# Patient Record
Sex: Female | Born: 1985 | Race: White | Hispanic: No | Marital: Married | Smoking: Never smoker
Health system: Southern US, Community
[De-identification: ages and names within clinical notes are randomized; demographics above are authoritative.]

## PROBLEM LIST (undated history)

## (undated) ENCOUNTER — Inpatient Hospital Stay (HOSPITAL_COMMUNITY): Payer: Self-pay

## (undated) DIAGNOSIS — R519 Headache, unspecified: Secondary | ICD-10-CM

## (undated) DIAGNOSIS — K219 Gastro-esophageal reflux disease without esophagitis: Secondary | ICD-10-CM

## (undated) DIAGNOSIS — R51 Headache: Secondary | ICD-10-CM

## (undated) DIAGNOSIS — F419 Anxiety disorder, unspecified: Secondary | ICD-10-CM

## (undated) DIAGNOSIS — T7840XA Allergy, unspecified, initial encounter: Secondary | ICD-10-CM

## (undated) HISTORY — DX: Anxiety disorder, unspecified: F41.9

## (undated) HISTORY — PX: ESOPHAGOGASTRODUODENOSCOPY: SHX1529

## (undated) HISTORY — DX: Allergy, unspecified, initial encounter: T78.40XA

## (undated) HISTORY — PX: TONSILLECTOMY AND ADENOIDECTOMY: SUR1326

## (undated) HISTORY — DX: Gastro-esophageal reflux disease without esophagitis: K21.9

---

## 2013-06-30 ENCOUNTER — Encounter (HOSPITAL_COMMUNITY): Payer: Self-pay | Admitting: Emergency Medicine

## 2013-06-30 ENCOUNTER — Emergency Department (INDEPENDENT_AMBULATORY_CARE_PROVIDER_SITE_OTHER): Payer: Self-pay

## 2013-06-30 ENCOUNTER — Emergency Department (INDEPENDENT_AMBULATORY_CARE_PROVIDER_SITE_OTHER)
Admission: EM | Admit: 2013-06-30 | Discharge: 2013-06-30 | Disposition: A | Discharge status: Home / Self Care | Payer: Self-pay | Source: Home / Self Care

## 2013-06-30 DIAGNOSIS — R141 Gas pain: Secondary | ICD-10-CM

## 2013-06-30 DIAGNOSIS — R0789 Other chest pain: Secondary | ICD-10-CM

## 2013-06-30 DIAGNOSIS — R140 Abdominal distension (gaseous): Secondary | ICD-10-CM

## 2013-06-30 NOTE — ED Provider Notes (Signed)
Medical screening examination/treatment/procedure(s) were performed by non-physician practitioner and as supervising physician I was immediately available for consultation/collaboration.  Leslee Home, M.D.  Reuben Likes, MD 06/30/13 [phone]

## 2013-06-30 NOTE — ED Notes (Signed)
Pt  Reports  Symptoms  Of  Bloating   With  Tightness  In  Chest  X  1  Week      Symptoms  Not  releived  By otc meds  Zantac   -     She  Takes  BCP  But  Reports  No  Recent   Air travel               Pt is  Sitting upright   On  Exam table  Speaking in  Complete  sentances     And  Is  In no  Acute  Distress    -  At this  Time  Pt is  Sitting  Upright on  Exam table  In no  Acute  Distress

## 2013-06-30 NOTE — ED Provider Notes (Signed)
CSN: [id number]     Arrival date & time 06/30/13  1247 History   First MD Initiated Contact with Patient 06/30/13 1344     Chief Complaint  Patient presents with  . Bloated   (Consider location/radiation/quality/duration/timing/severity/associated sxs/prior Treatment) HPI Comments: 27 year old female presents with a complaint of tightness in the upper chest for approximately one week. She states it is worse at night time particularly when A.m. and better during the day. Occasionally she will have shortness of breath. Denies sore throat or upper respiratory congestion. Denies neck pain or extremity pain. She also complains of bloating in the abdomen and recent constipation that has much improved after use of laxatives.   History reviewed. No pertinent past medical history. History reviewed. No pertinent past surgical history. History reviewed. No pertinent family history. History  Substance Use Topics  . Smoking status: Never Smoker   . Smokeless tobacco: Not on file  . Alcohol Use: Yes   OB History   Grav Para Term Preterm Abortions TAB SAB Ect Mult Living                 Review of Systems  Constitutional: Negative.  Negative for fever.  HENT: Negative.   Respiratory: Positive for chest tightness. Negative for cough, wheezing and stridor.   Cardiovascular: Positive for chest pain. Negative for palpitations and leg swelling.  Gastrointestinal: Positive for constipation. Negative for nausea, vomiting and abdominal distention.       Bloating feeling  Genitourinary: Negative.   Musculoskeletal: Negative.   Skin: Negative.   Neurological: Negative.     Allergies  Review of patient's allergies indicates not on file.  Home Medications   Current Outpatient Rx  Name  Route  Sig  Dispense  Refill  . Ranitidine HCl (ZANTAC PO)   Oral   Take by mouth.          BP 126/82  Pulse 73  Temp(Src) 99.1 F (37.3 C) (Oral)  Resp 16  SpO2 99%  LMP 06/16/2013 Physical Exam   Nursing note and vitals reviewed. Constitutional: She is oriented to person, place, and time. She appears well-developed and well-nourished. No distress.  Relaxed posturing, no distress, calm even voice.  HENT:  Right Ear: External ear normal.  Left Ear: External ear normal.  Mouth/Throat: Oropharynx is clear and moist. No oropharyngeal exudate.  Eyes: Conjunctivae and EOM are normal.  Neck: Normal range of motion. Neck supple.  Cardiovascular: Normal rate, regular rhythm and normal heart sounds.   No murmur heard. Pulmonary/Chest: Effort normal and breath sounds normal. No respiratory distress. She has no wheezes. She has no rales.  Abdominal: Soft. Bowel sounds are normal. She exhibits no distension and no mass. There is no rebound and no guarding.  Minor tenderness across mid-lower abdomen.  Musculoskeletal: She exhibits no edema.  Lymphadenopathy:    She has no cervical adenopathy.  Neurological: She is alert and oriented to person, place, and time. She exhibits normal muscle tone.  Skin: Skin is warm and dry. No rash noted. No erythema.  Psychiatric: She has a normal mood and affect.    ED Course  Procedures (including critical care time) Labs Review Labs Reviewed - No data to display Imaging Review Dg Abd 1 View  06/30/2013   CLINICAL DATA:  Abdominal bloating  EXAM: ABDOMEN - 1 VIEW  COMPARISON:  None.  FINDINGS: The bowel gas pattern is normal. No radio-opaque calculi or other significant radiographic abnormality are seen. Minimal leftward curvature of the lumbar spine  is noted centered at L3.  IMPRESSION: Negative.   Electronically Signed   By: Christiana Pellant M.D.   On: 06/30/2013 15:09    EKG: NSR, no ectopy, inverted T waves V2, V3, III.   MDM   1. Atypical chest pain   2. Abdominal bloating     Although her symptoms are relatively atypical swelling this may be more at gastrointestinal and anything else. She does have early satiety, feeling of bloating, decreased  appetite possibly reflux that she has trouble describing. She feels relatively comfortable and she is in no distress at this time. EKG is  within normal limits. she is advised that we do not have a specific diagnosis for her chest pain or abdominal bloating and that to obtain more test for diagnoses and to rule out other problems she should go to the emergency department. She declines to do that at this time and states she is feeling "okay". We will arrange to [facility] a [facility] but if she gets worse with chest pain, sweating, shortness of breath, weakness, syncope or worsening abdominal pain, vomiting she should go to emergency department promptly.  This case was discussed with Dr. Mayme Genta, NP 06/30/13 [phone]

## 2013-12-29 ENCOUNTER — Institutional Professional Consult (permissible substitution): Payer: Self-pay | Admitting: Medical

## 2014-01-08 ENCOUNTER — Encounter: Payer: Self-pay | Admitting: Medical

## 2014-01-08 ENCOUNTER — Ambulatory Visit (INDEPENDENT_AMBULATORY_CARE_PROVIDER_SITE_OTHER): Payer: BC Managed Care – PPO | Admitting: Medical

## 2014-01-08 VITALS — BP 112/80 | HR 68 | Temp 98.4°F | Resp 16 | Wt 196.0 lb

## 2014-01-08 DIAGNOSIS — R109 Unspecified abdominal pain: Secondary | ICD-10-CM

## 2014-01-08 DIAGNOSIS — R0789 Other chest pain: Secondary | ICD-10-CM

## 2014-01-08 DIAGNOSIS — R12 Heartburn: Secondary | ICD-10-CM

## 2014-01-08 LAB — POCT URINALYSIS DIPSTICK
Bilirubin, UA: NEGATIVE
Blood, UA: NEGATIVE
GLUCOSE UA: NEGATIVE
Ketones, UA: NEGATIVE
Leukocytes, UA: NEGATIVE
Nitrite, UA: NEGATIVE
Urobilinogen, UA: NEGATIVE
pH, UA: 8

## 2014-01-08 LAB — CBC WITH DIFFERENTIAL/PLATELET
Basophils Absolute: 0 10*3/uL (ref 0.0–0.1)
Basophils Relative: 0 % (ref 0–1)
Eosinophils Absolute: 0.2 10*3/uL (ref 0.0–0.7)
Eosinophils Relative: 2 % (ref 0–5)
HEMATOCRIT: 41.3 % (ref 36.0–46.0)
Hemoglobin: 14.3 g/dL (ref 12.0–15.0)
LYMPHS ABS: 2.8 10*3/uL (ref 0.7–4.0)
LYMPHS PCT: 32 % (ref 12–46)
MCH: 29.9 pg (ref 26.0–34.0)
MCHC: 34.6 g/dL (ref 30.0–36.0)
MCV: 86.2 fL (ref 78.0–100.0)
MONO ABS: 0.5 10*3/uL (ref 0.1–1.0)
MONOS PCT: 6 % (ref 3–12)
NEUTROS ABS: 5.2 10*3/uL (ref 1.7–7.7)
Neutrophils Relative %: 60 % (ref 43–77)
Platelets: 356 10*3/uL (ref 150–400)
RBC: 4.79 MIL/uL (ref 3.87–5.11)
RDW: 13 % (ref 11.5–15.5)
WBC: 8.6 10*3/uL (ref 4.0–10.5)

## 2014-01-08 LAB — COMPREHENSIVE METABOLIC PANEL
ALT: 9 U/L (ref 0–35)
AST: 15 U/L (ref 0–37)
Albumin: 3.9 g/dL (ref 3.5–5.2)
Alkaline Phosphatase: 46 U/L (ref 39–117)
BUN: 6 mg/dL (ref 6–23)
CALCIUM: 9.2 mg/dL (ref 8.4–10.5)
CHLORIDE: 102 meq/L (ref 96–112)
CO2: 28 mEq/L (ref 19–32)
Creat: 0.73 mg/dL (ref 0.50–1.10)
Glucose, Bld: 78 mg/dL (ref 70–99)
Potassium: 3.8 mEq/L (ref 3.5–5.3)
Sodium: 138 mEq/L (ref 135–145)
Total Bilirubin: 0.4 mg/dL (ref 0.2–1.2)
Total Protein: 6.9 g/dL (ref 6.0–8.3)

## 2014-01-08 LAB — POCT URINE PREGNANCY: PREG TEST UR: NEGATIVE

## 2014-01-08 MED ORDER — FAMOTIDINE 20 MG PO TABS: 20.0000 mg | ORAL_TABLET | Freq: Every day | ORAL | Status: DC

## 2014-01-08 MED ORDER — OMEPRAZOLE 40 MG PO CPDR: DELAYED_RELEASE_CAPSULE | ORAL | Status: DC

## 2014-01-08 NOTE — Patient Instructions (Signed)
Thank you for giving me the opportunity to serve you today.    Your diagnosis today includes: Encounter Diagnoses  Name Primary?  . Abdominal pain, unspecified site Yes  . Chest pressure   . Heartburn      Specific recommendations today include:  Your symptoms today suggest possible ulcer, possible gallbladder disease, and acid reflux  Begin prescription omeprazole 30-45 minutes daily before breakfast  Begin famotidine either at bedtime or twice daily also to help with reflux and acid  Avoid foods that can aggravate ulcers such as acidic foods, spicy foods, greasy foods, peppers  Avoid anti-inflammatories such as Aleve, Advil, Ibuprofen that could aggravate ulcers  Avoid food that can make gallbladder disease worse such as fatty meals, greasy foods, big portions, alcohol  Eat small portions in general  We will call with lab results  Return pending labs, 2wk.    I have included other useful information below for your review.  Peptic Ulcer A peptic ulcer is a sore in the lining of in your esophagus (esophageal ulcer), stomach (gastric ulcer), or in the first part of your small intestine (duodenal ulcer). The ulcer causes erosion into the deeper tissue. CAUSES  Normally, the lining of the stomach and the small intestine protects itself from the acid that digests food. The protective lining can be damaged by:  An infection caused by a bacterium called Helicobacter pylori (H. pylori).  Regular use of nonsteroidal anti-inflammatory drugs (NSAIDs), such as ibuprofen or aspirin.  Smoking tobacco. Other risk factors include being older than 50, drinking alcohol excessively, and having a family history of ulcer disease.  SYMPTOMS   Burning pain or gnawing in the area between the chest and the belly button.  Heartburn.  Nausea and vomiting.  Bloating. The pain can be worse on an empty stomach and at night. If the ulcer results in bleeding, it can cause:  Black, tarry  stools.  Vomiting of bright red blood.  Vomiting of coffee ground looking materials. DIAGNOSIS  A diagnosis is usually made based upon your history and an exam. Other tests and procedures may be performed to find the cause of the ulcer. Finding a cause will help determine the best treatment. Tests and procedures may include:  Blood tests, stool tests, or breath tests to check for the bacterium H. pylori.  An upper gastrointestinal (GI) series of the esophagus, stomach, and small intestine.  An endoscopy to examine the esophagus, stomach, and small intestine.  A biopsy. TREATMENT  Treatment may include:  Eliminating the cause of the ulcer, such as smoking, NSAIDs, or alcohol.  Medicines to reduce the amount of acid in your digestive tract.  Antibiotic medicines if the ulcer is caused by the H. pylori bacterium.  An upper endoscopy to treat a bleeding ulcer.  Surgery if the bleeding is severe or if the ulcer created a hole somewhere in the digestive system. HOME CARE INSTRUCTIONS   Avoid tobacco, alcohol, and caffeine. Smoking can increase the acid in the stomach, and continued smoking will impair the healing of ulcers.  Avoid foods and drinks that seem to cause discomfort or aggravate your ulcer.  Only take medicines as directed by your caregiver. Do not substitute over-the-counter medicines for prescription medicines without talking to your caregiver.  Keep any follow-up appointments and tests as directed. SEEK MEDICAL CARE IF:   Your do not improve within 7 days of starting treatment.  You have ongoing indigestion or heartburn. SEEK IMMEDIATE MEDICAL CARE IF:   You have  sudden, sharp, or persistent abdominal pain.  You have bloody or dark black, tarry stools.  You vomit blood or vomit that looks like coffee grounds.  You become light headed, weak, or feel faint.  You become sweaty or clammy. MAKE SURE YOU:   Understand these instructions.  Will watch your  condition.  Will get help right away if you are not doing well or get worse. Document Released: 08/14/2000 Document Revised: 05/11/2012 Document Reviewed: 03/16/2012 [facility] Patient Information 2014 [place], [place].   Cholecystitis Cholecystitis is an inflammation of your gallbladder. It is usually caused by a buildup of gallstones or sludge (cholelithiasis) in your gallbladder. The gallbladder stores a fluid that helps digest fats (bile). Cholecystitis is serious and needs treatment right away.  CAUSES   Gallstones. Gallstones can block the tube that leads to your gallbladder, causing bile to build up. As bile builds up, the gallbladder becomes inflamed.  Bile duct problems, such as blockage from scarring or kinking.  Tumors. Tumors can stop bile from leaving your gallbladder correctly, causing bile to build up. As bile builds up, the gallbladder becomes inflamed. SYMPTOMS   Nausea.  Vomiting.  Abdominal pain, especially in the upper right area of your abdomen.  Abdominal tenderness or bloating.  Sweating.  Chills.  Fever.  Yellowing of the skin and the whites of the eyes (jaundice). DIAGNOSIS  Your caregiver may order blood tests to look for infection or gallbladder problems. Your caregiver may also order imaging tests, such as an ultrasound or computed tomography (CT) scan. Further tests may include a hepatobiliary iminodiacetic acid (HIDA) scan. This scan allows your caregiver to see your bile move from the liver to the gallbladder and to the small intestine. TREATMENT  A hospital stay is usually necessary to lessen the inflammation of your gallbladder. You may be required to not eat or drink (fast) for a certain amount of time. You may be given medicine to treat pain or an antibiotic medicine to treat an infection. Surgery may be needed to remove your gallbladder (cholecystectomy) once the inflammation has gone down. Surgery may be needed right away if you develop  complications such as death of gallbladder tissue (gangrene) or a tear (perforation) of the gallbladder.  HOME CARE INSTRUCTIONS  Home care will depend on your treatment. In general:  If you were given antibiotics, take them as directed. Finish them even if you start to feel better.  Only take over-the-counter or prescription medicines for pain, discomfort, or fever as directed by your caregiver.  Follow a low-fat diet until you see your caregiver again.  Keep all follow-up visits as directed by your caregiver. SEEK IMMEDIATE MEDICAL CARE IF:   Your pain is increasing and not controlled by medicines.  Your pain moves to another part of your abdomen or to your back.  You have a fever.  You have nausea and vomiting. MAKE SURE YOU:  Understand these instructions.  Will watch your condition.  Will get help right away if you are not doing well or get worse. Document Released: 08/17/2005 Document Revised: 11/09/2011 Document Reviewed: 07/03/2011 [facility]ExitCare Patient Information 2014 [place]ExitCare, [place].

## 2014-01-08 NOTE — Progress Notes (Signed)
Subjective:   Yvonne Brown is a 28 y.o. female presenting on 01/08/2014 with pressure and heaviness on her chest area for about a year and had reflux and she took the 14 day of Nexium it help some  Here as a new patient.  She notes heaviness in chest, pressure in chest.   Had similar last Halloween, went to urgent care, had EKG and CXR.  No problem found.  However, pressure in chest is more constant.  Use to be more intermittent, but worse of late.  Worse when lying down.  occasionally has sharp pains in right arm pit.   In the last week, having chest pressure/heaviness of chest.  Lately seems to be constant, not going away.  Feels some pain in lower abdomen.  Feels some discomfort over her kidneys.   Sometimes chest pressure worse after eating, particular fried foods.  After eating or fried food, the pressure is intensified.  Denies belching a lot.   The chest pressure is worse lying down.   Denies wheezing, SOB, fever, nausea, vomiting, diarrhea, constipation.  Been taking OTC Nexium 14 days which helped heartburn and reflux, but didn't help chest pressure.  Denies lumps or masses in axilla or breasts.  Never had a UTI before.   Denies urinary burning, but is having some frequently, no urgency though.   No blood in stool or urine.    Denies chest pressure/pain worse with activity. She is sexually active, no vagina c/o, no abnormal bleeding. On OCPs.  Father has hx/o bowel obstruction, hx/o throat cancer, no other GI issues in family.  Nonsomker.  Drinks on average 3 beers on the weekends.  No other aggravating or relieving factors.  No other complaint.  Review of Systems ROS as in subjective      Objective:     Filed Vitals:   01/08/14 1030  BP: 112/80  Pulse: 68  Temp: 98.4 F (36.9 C)  Resp: 16    General appearance: alert, no distress, WD/WN HEENT: normocephalic, sclerae anicteric, TMs pearly, nares patent, no discharge or erythema, pharynx normal Oral cavity: MMM, no  lesions Neck: supple, no lymphadenopathy, no thyromegaly, no masses Heart: RRR, normal S1, S2, no murmurs Lungs: CTA bilaterally, no wheezes, rhonchi, or rales Abdomen: +bs, soft, mild generalized abdominal tenderness, worse across lower abdomen, no LUQ tenderness, otherwise non distended, no masses, no hepatomegaly, no splenomegaly Back: nontender Pulses: 2+ symmetric, upper and lower extremities, normal cap refill      Assessment: Encounter Diagnoses  Name Primary?  . Abdominal pain, unspecified site Yes  . Chest pressure   . Heartburn      Plan: We discussed her symptoms, exam findings, labs today. Symptoms suggest acid reflux, possible ulcer disease, possible gallbladder disease as well but not clear  Begin omeprazole, begin famotidine, avoid food triggers  Recheck in 2 weeks, sooner when necessary  Yvonne Brown was seen today for pressure and heaviness on her chest area for about a year and had reflux and she took the 14 day of nexium it help some.  Diagnoses and associated orders for this visit:  Abdominal pain, unspecified site - Comprehensive metabolic panel - CBC with Differential - Urinalysis Dipstick - POCT urine pregnancy  Chest pressure - Comprehensive metabolic panel - CBC with Differential  Heartburn - Comprehensive metabolic panel - CBC with Differential  Other Orders - omeprazole (PRILOSEC) 40 MG capsule; Take 1 tablet 30-45 minutes prior to breakfast - famotidine (PEPCID) 20 MG tablet; Take 1 tablet (20 mg  total) by mouth at bedtime.    Return pending labs, 2wk.

## 2014-01-09 ENCOUNTER — Encounter: Payer: Self-pay | Admitting: Family Medicine

## 2014-01-29 ENCOUNTER — Ambulatory Visit (INDEPENDENT_AMBULATORY_CARE_PROVIDER_SITE_OTHER): Payer: BC Managed Care – PPO | Admitting: Medical

## 2014-01-29 ENCOUNTER — Encounter: Payer: Self-pay | Admitting: Medical

## 2014-01-29 ENCOUNTER — Telehealth: Payer: Self-pay | Admitting: Medical

## 2014-01-29 VITALS — BP 100/70 | HR 60 | Temp 98.2°F | Resp 16 | Wt 194.0 lb

## 2014-01-29 DIAGNOSIS — R0789 Other chest pain: Secondary | ICD-10-CM

## 2014-01-29 DIAGNOSIS — R109 Unspecified abdominal pain: Secondary | ICD-10-CM

## 2014-01-29 NOTE — Progress Notes (Signed)
Subjective: Here for f/u on chest pressure/tightness, heartburn.  Since last visit taking Omeprazole in the morning before breakfast and Famotidine QHS.  Can't tell any improvement on the pressure, but having improvement on heartburn.  Has cut back on acidic and spicy foods, avoiding eating before 2 hours of bedtime.   Pain is worse with fried foods.  Sitting against chair or lying down seems to make it worse.   No SOB.  No problems with voiding or stooling, no fever.  Dad has hx/o small bowel obstruction.  PFM with hx/o gall bladder cancer.  Of note she did see urgent care within the past month or 2 for same, had EKG and chest x-ray which reportedly normal. He has not had an ultrasound, no prior abdominal surgery. No other aggravating or relieving facotrs.  No other c/o.   ROS as in subjective  History reviewed. No pertinent past medical history. Past Surgical History  Procedure Laterality Date  . Tonsillectomy and adenoidectomy     Objective: Filed Vitals:   01/29/14 0811  BP: 100/70  Pulse: 60  Temp: 98.2 F (36.8 C)  Resp: 16   General appearance: alert, no distress, WD/WN,  Neck: supple, no lymphadenopathy, no thyromegaly, no masses Heart: RRR, normal S1, S2, no murmurs Lungs: CTA bilaterally, no wheezes, rhonchi, or rales Abdomen: +bs, soft, non tender, non distended, no masses, no hepatomegaly, no splenomegaly Pulses: 2+ symmetric, upper and lower extremities, normal cap refill  Assessment: Encounter Diagnoses  Name Primary?  . Abdominal pain, unspecified site Yes  . Chest pressure    Plan: No improvement with H2 blocker and PPI, if symptoms still suggest possible gallbladder disease versus other gastric problem.  We will set up for abdominal ultrasound, and discussed the possibility of HIDA scan

## 2014-01-29 NOTE — Telephone Encounter (Signed)
Working on this, will call patient when done. CLS

## 2014-01-29 NOTE — Telephone Encounter (Signed)
Refer for abdominal ultrasound

## 2014-01-31 ENCOUNTER — Other Ambulatory Visit: Payer: Self-pay | Admitting: Family Medicine

## 2014-01-31 ENCOUNTER — Telehealth: Payer: Self-pay | Admitting: Family Medicine

## 2014-01-31 NOTE — Telephone Encounter (Signed)
Patient has an appointment to have a abdomen ultrasound on 02/06/14 @ 945 am. CLS NOTHING TO EAT OR DRINK AFTER MIDNIGHT  [facility] [address]  [place], [place]  [phone]

## 2014-01-31 NOTE — Telephone Encounter (Signed)
LMOM TO CB. CLS 

## 2014-02-01 NOTE — Telephone Encounter (Signed)
I left a message with all of the appointment details. CLS

## 2014-02-06 ENCOUNTER — Other Ambulatory Visit: Payer: Self-pay | Admitting: Family Medicine

## 2014-02-06 ENCOUNTER — Ambulatory Visit
Admission: RE | Admit: 2014-02-06 | Discharge: 2014-02-06 | Disposition: A | Discharge status: Home / Self Care | Payer: BC Managed Care – PPO | Source: Ambulatory Visit | Attending: Medical | Admitting: Medical

## 2014-02-06 DIAGNOSIS — R109 Unspecified abdominal pain: Secondary | ICD-10-CM

## 2014-02-06 DIAGNOSIS — R0789 Other chest pain: Secondary | ICD-10-CM

## 2014-02-06 MED ORDER — FAMOTIDINE 20 MG PO TABS: 20.0000 mg | ORAL_TABLET | Freq: Every day | ORAL | Status: DC

## 2014-02-06 MED ORDER — OMEPRAZOLE 40 MG PO CPDR: DELAYED_RELEASE_CAPSULE | ORAL | Status: DC

## 2016-02-24 ENCOUNTER — Other Ambulatory Visit: Payer: Self-pay | Admitting: Obstetrics & Gynecology

## 2016-02-24 ENCOUNTER — Other Ambulatory Visit (HOSPITAL_COMMUNITY)
Admission: RE | Admit: 2016-02-24 | Discharge: 2016-02-24 | Disposition: A | Discharge status: Home / Self Care | Payer: Managed Care, Other (non HMO) | Source: Ambulatory Visit | Attending: Obstetrics and Gynecology | Admitting: Obstetrics and Gynecology

## 2016-02-24 DIAGNOSIS — Z01419 Encounter for gynecological examination (general) (routine) without abnormal findings: Secondary | ICD-10-CM | POA: Diagnosis present

## 2016-02-25 LAB — CYTOLOGY - PAP

## 2016-09-02 DIAGNOSIS — M9902 Segmental and somatic dysfunction of thoracic region: Secondary | ICD-10-CM | POA: Diagnosis not present

## 2016-09-02 DIAGNOSIS — R51 Headache: Secondary | ICD-10-CM | POA: Diagnosis not present

## 2016-09-02 DIAGNOSIS — M9901 Segmental and somatic dysfunction of cervical region: Secondary | ICD-10-CM | POA: Diagnosis not present

## 2016-09-22 DIAGNOSIS — R51 Headache: Secondary | ICD-10-CM | POA: Diagnosis not present

## 2016-09-22 DIAGNOSIS — M9901 Segmental and somatic dysfunction of cervical region: Secondary | ICD-10-CM | POA: Diagnosis not present

## 2016-09-22 DIAGNOSIS — M9902 Segmental and somatic dysfunction of thoracic region: Secondary | ICD-10-CM | POA: Diagnosis not present

## 2016-10-13 DIAGNOSIS — M9901 Segmental and somatic dysfunction of cervical region: Secondary | ICD-10-CM | POA: Diagnosis not present

## 2016-10-13 DIAGNOSIS — R51 Headache: Secondary | ICD-10-CM | POA: Diagnosis not present

## 2016-10-13 DIAGNOSIS — M9902 Segmental and somatic dysfunction of thoracic region: Secondary | ICD-10-CM | POA: Diagnosis not present

## 2016-10-27 DIAGNOSIS — R51 Headache: Secondary | ICD-10-CM | POA: Diagnosis not present

## 2016-10-27 DIAGNOSIS — M9902 Segmental and somatic dysfunction of thoracic region: Secondary | ICD-10-CM | POA: Diagnosis not present

## 2016-10-27 DIAGNOSIS — M9901 Segmental and somatic dysfunction of cervical region: Secondary | ICD-10-CM | POA: Diagnosis not present

## 2016-11-13 ENCOUNTER — Emergency Department
Admission: EM | Admit: 2016-11-13 | Discharge: 2016-11-13 | Disposition: A | Discharge status: Home / Self Care | Payer: BLUE CROSS/BLUE SHIELD | Attending: Emergency Medicine | Admitting: Emergency Medicine

## 2016-11-13 ENCOUNTER — Encounter: Payer: Self-pay | Admitting: Emergency Medicine

## 2016-11-13 ENCOUNTER — Emergency Department: Payer: BLUE CROSS/BLUE SHIELD

## 2016-11-13 DIAGNOSIS — R51 Headache: Secondary | ICD-10-CM | POA: Diagnosis not present

## 2016-11-13 DIAGNOSIS — G8929 Other chronic pain: Secondary | ICD-10-CM | POA: Diagnosis not present

## 2016-11-13 LAB — POCT PREGNANCY, URINE: Preg Test, Ur: NEGATIVE

## 2016-11-13 MED ORDER — KETOROLAC TROMETHAMINE 30 MG/ML IJ SOLN
30.0000 mg | Freq: Once | INTRAMUSCULAR | Status: AC
  Administered 2016-11-13: 30 mg via INTRAVENOUS
  Filled 2016-11-13: qty 1

## 2016-11-13 MED ORDER — DIPHENHYDRAMINE HCL 50 MG/ML IJ SOLN
25.0000 mg | Freq: Once | INTRAMUSCULAR | Status: AC
  Administered 2016-11-13: 25 mg via INTRAVENOUS
  Filled 2016-11-13: qty 1

## 2016-11-13 MED ORDER — KETOROLAC TROMETHAMINE 10 MG PO TABS: 10.0000 mg | ORAL_TABLET | Freq: Three times a day (TID) | ORAL | 0 refills | Status: DC

## 2016-11-13 MED ORDER — SODIUM CHLORIDE 0.9 % IV BOLUS (SEPSIS)
1000.0000 mL | Freq: Once | INTRAVENOUS | Status: AC
  Administered 2016-11-13: 1000 mL via INTRAVENOUS

## 2016-11-13 MED ORDER — METOCLOPRAMIDE HCL 5 MG/ML IJ SOLN
10.0000 mg | Freq: Once | INTRAMUSCULAR | Status: AC
  Administered 2016-11-13: 10 mg via INTRAVENOUS
  Filled 2016-11-13: qty 2

## 2016-11-13 NOTE — ED Notes (Signed)
See triage note states she was on z-pak for sinus infection and is concerned that she still may have the infection.  Denies any fever or photosensitivity

## 2016-11-13 NOTE — ED Provider Notes (Signed)
[facility] Emergency Department Provider Note   ____________________________________________   First MD Initiated Contact with Patient 11/13/16 1418     (approximate)  I have reviewed the triage vital signs and the nursing notes.   HISTORY  Chief Complaint Headache    HPI Yvonne Brown is a 31 y.o. female patient is here with complaint of headache for one month. Patient describes it as a pressure sensation that radiates from forehead to the sides for head and then posteriorly. Patient states that headache varies in intensity. Patient states she has some slight nausea but denies any vomiting. She denies any changes in vision. Patient has been to a medical office where she was diagnosed with a sinus infection and has taken 3 Z-Paks without any relief of her symptoms. Denies any fever or chills. There's been no cough or congestion. Patient was taking Zyrtec every day but discontinued taking this as well. Patient denies any previous history of headaches. She has also taken Sudafed and Tylenol to relieve any sinus congestion that may be causing her headache. This also has not given her any relief. Patient was able to drive herself to the emergency room. She is here to "get a CAT scan". Patient states that she requested one from her last "doctor"and did not have one ordered. Patient states that she has seen neurology in the past for headaches and has never had a CT scan. She rates her pain as a 9/10 at this time.   History reviewed. No pertinent past medical history.  There are no active problems to display for this patient.   Past Surgical History:  Procedure Laterality Date  . TONSILLECTOMY AND ADENOIDECTOMY      Prior to Admission medications   Medication Sig Start Date End Date Taking? Authorizing Provider  ketorolac (TORADOL) 10 MG tablet Take 1 tablet (10 mg total) by mouth every 8 (eight) hours. 11/13/16   Tommi Rumpshonda L Summers, PA-C  Norgestimate-Ethinyl  Estradiol Triphasic (TRI-SPRINTEC) 0.18/0.215/0.25 MG-35 MCG tablet Take 1 tablet by mouth daily.    Historical Provider, MD    Allergies Patient has no known allergies.  No family history on file.  Social History Social History  Substance Use Topics  . Smoking status: Never Smoker  . Smokeless tobacco: Never Used  . Alcohol use Yes    Review of Systems Constitutional: No fever/chills Eyes: No visual changes. ENT: No sore throat. Cardiovascular: Denies chest pain. Respiratory: Denies shortness of breath. Gastrointestinal: No abdominal pain.  No nausea, no vomiting.   Musculoskeletal: Negative for back pain. Skin: Negative for rash. Neurological: Positive for headaches, negative for focal weakness or numbness. 10-point ROS otherwise negative.  ____________________________________________   PHYSICAL EXAM:  VITAL SIGNS: ED Triage Vitals  Enc Vitals Group     BP 11/13/16 1402 140/81     Pulse Rate 11/13/16 1402 74     Resp 11/13/16 1402 18     Temp 11/13/16 1402 98.2 F (36.8 C)     Temp Source 11/13/16 1402 Oral     SpO2 --      Weight 11/13/16 1403 215 lb (97.5 kg)     Height 11/13/16 1403 5\' 7"  (1.702 m)     Head Circumference --      Peak Flow --      Pain Score 11/13/16 1418 9     Pain Loc --      Pain Edu? --      Excl. in GC? --     Constitutional:  Alert and oriented. Well appearing and in no acute distress.  Patient is talking and answering questions appropriately. Eyes: Conjunctivae are normal. PERRL. EOMI. Head: Atraumatic. Nose: No congestion/rhinnorhea.  EACs and  TMs are clear. Mouth/Throat: Mucous membranes are moist.  Oropharynx non-erythematous. Neck: No stridor.  Nontender cervical spine to palpation posteriorly. Range of motion is normal in  all planes. There is no nuchal rigidity noted. Hematological/Lymphatic/Immunilogical: No cervical lymphadenopathy. Cardiovascular: Normal rate, regular rhythm. Grossly normal heart sounds.  Good peripheral  circulation. Respiratory: Normal respiratory effort.  No retractions. Lungs CTAB. Musculoskeletal: His upper and lower extremities without any difficulty. Normal gait was noted. Neurologic:  Normal speech and language. No gross focal neurologic deficits are appreciated. Cranial nerves II through XII grossly intact. No gait instability. Skin:  Skin is warm, dry and intact. No rash noted. Psychiatric: Mood and affect are normal. Speech and behavior are normal.  ____________________________________________   LABS (all labs ordered are listed, but only abnormal results are displayed)  Labs Reviewed  POC URINE PREG, ED  POCT PREGNANCY, URINE      RADIOLOGY CT head scan without contrast per radiologist: IMPRESSION: Normal examination.  ____________________________________________   PROCEDURES  Procedure(s) performed: None  Procedures  Critical Care performed: No  ____________________________________________   INITIAL IMPRESSION / ASSESSMENT AND PLAN / ED COURSE  Pertinent labs & imaging results that were available during my care of the patient were reviewed by me and considered in my medical decision making (see chart for details).  Patient was reassured that CT scan was negative. She was given normal saline 1 L IV along with headache cocktail consisting of Reglan 10 mg, Benadryl 25 mg, and Toradol 30 mg IV. Patient voiced improvement of her headache and had reduced by half. Patient was given a prescription for Toradol 10 mg 1 3 times a day with food for 2 days. She is to follow-up with [facility] if any continued problems or doctor of her choice. Patient states that she has seen a neurologist in the past but was "never going back". Patient is encouraged to drink plenty of fluids.      ____________________________________________   FINAL CLINICAL IMPRESSION(S) / ED DIAGNOSES  Final diagnoses:  Chronic nonintractable headache, unspecified headache type      NEW  MEDICATIONS STARTED DURING THIS VISIT:  Discharge Medication List as of 11/13/2016  5:44 PM    START taking these medications   Details  ketorolac (TORADOL) 10 MG tablet Take 1 tablet (10 mg total) by mouth every 8 (eight) hours., Starting Fri 11/13/2016, Print         Note:  This document was prepared using Dragon voice recognition software and may include unintentional dictation errors.    Tommi Rumps, PA-C 11/13/16 1830    Nita Sickle, MD 11/18/16 2233

## 2016-11-13 NOTE — Discharge Instructions (Signed)
[facility] if any continued problems with headaches. Your CT today did not show any masses, lesions, bleeding, calcifications, areas of stroke. Increase fluids to stay hydrated. Toradol one tablet 3 times a day with food for 2 days.

## 2016-11-13 NOTE — ED Triage Notes (Signed)
Presents with headaches  States she has had headaches for about 1 month  Describes as pressure..unsure of fever  Slight nausea

## 2016-11-24 DIAGNOSIS — M9902 Segmental and somatic dysfunction of thoracic region: Secondary | ICD-10-CM | POA: Diagnosis not present

## 2016-11-24 DIAGNOSIS — R51 Headache: Secondary | ICD-10-CM | POA: Diagnosis not present

## 2016-11-24 DIAGNOSIS — M9901 Segmental and somatic dysfunction of cervical region: Secondary | ICD-10-CM | POA: Diagnosis not present

## 2016-12-15 DIAGNOSIS — M9902 Segmental and somatic dysfunction of thoracic region: Secondary | ICD-10-CM | POA: Diagnosis not present

## 2016-12-15 DIAGNOSIS — M9901 Segmental and somatic dysfunction of cervical region: Secondary | ICD-10-CM | POA: Diagnosis not present

## 2016-12-15 DIAGNOSIS — R51 Headache: Secondary | ICD-10-CM | POA: Diagnosis not present

## 2017-01-05 DIAGNOSIS — M9902 Segmental and somatic dysfunction of thoracic region: Secondary | ICD-10-CM | POA: Diagnosis not present

## 2017-01-05 DIAGNOSIS — M9901 Segmental and somatic dysfunction of cervical region: Secondary | ICD-10-CM | POA: Diagnosis not present

## 2017-01-05 DIAGNOSIS — R51 Headache: Secondary | ICD-10-CM | POA: Diagnosis not present

## 2017-01-13 DIAGNOSIS — E7800 Pure hypercholesterolemia, unspecified: Secondary | ICD-10-CM | POA: Diagnosis not present

## 2017-01-13 DIAGNOSIS — E039 Hypothyroidism, unspecified: Secondary | ICD-10-CM | POA: Diagnosis not present

## 2017-01-13 DIAGNOSIS — J3489 Other specified disorders of nose and nasal sinuses: Secondary | ICD-10-CM | POA: Diagnosis not present

## 2017-01-13 DIAGNOSIS — E119 Type 2 diabetes mellitus without complications: Secondary | ICD-10-CM | POA: Diagnosis not present

## 2017-01-13 DIAGNOSIS — R52 Pain, unspecified: Secondary | ICD-10-CM | POA: Diagnosis not present

## 2017-01-13 DIAGNOSIS — Z139 Encounter for screening, unspecified: Secondary | ICD-10-CM | POA: Diagnosis not present

## 2017-01-13 DIAGNOSIS — R51 Headache: Secondary | ICD-10-CM | POA: Diagnosis not present

## 2017-01-18 ENCOUNTER — Emergency Department
Admission: EM | Admit: 2017-01-18 | Discharge: 2017-01-18 | Disposition: A | Discharge status: Home / Self Care | Payer: BLUE CROSS/BLUE SHIELD | Attending: Emergency Medicine | Admitting: Emergency Medicine

## 2017-01-18 ENCOUNTER — Encounter: Payer: Self-pay | Admitting: Emergency Medicine

## 2017-01-18 DIAGNOSIS — R51 Headache: Secondary | ICD-10-CM | POA: Diagnosis present

## 2017-01-18 DIAGNOSIS — J0141 Acute recurrent pansinusitis: Secondary | ICD-10-CM | POA: Diagnosis not present

## 2017-01-18 LAB — POCT RAPID STREP A: Streptococcus, Group A Screen (Direct): NEGATIVE

## 2017-01-18 MED ORDER — PREDNISONE 10 MG PO TABS: ORAL_TABLET | ORAL | 0 refills | Status: DC

## 2017-01-18 MED ORDER — FLUTICASONE PROPIONATE 50 MCG/ACT NA SUSP: 2.0000 | Freq: Every day | NASAL | 0 refills | Status: DC

## 2017-01-18 MED ORDER — AMOXICILLIN-POT CLAVULANATE 875-125 MG PO TABS: 1.0000 | ORAL_TABLET | Freq: Two times a day (BID) | ORAL | 0 refills | Status: AC

## 2017-01-18 NOTE — ED Notes (Signed)
See triage note  States she developed frontal headache and sinus pressure for couple of months  Recently finished z-pak  But states pain is getting worse

## 2017-01-18 NOTE — ED Triage Notes (Signed)
Bilateral earache and sinus pain x 2 months, worse in past week.

## 2017-01-18 NOTE — ED Notes (Signed)
FIRST NURSE: pt with headache going on for "a while". Pt thinks may be her sinuses. Pt crying at stat desk. No acute distress noted.

## 2017-01-18 NOTE — Discharge Instructions (Signed)
Follow-up with Dr. Elenore RotaJuengel.   Call and make an appointment. Begin taking antibiotics as directed twice a day for the next 14 days. Flonase nasal spray as directed and prednisone 60 mg and taper down to 1 tablet as directed.

## 2017-01-18 NOTE — ED Provider Notes (Signed)
[facility] Emergency Department Provider Note ____________________________________________  Time seen: 10:01 AM  I have reviewed the triage vital signs and the nursing notes.  HISTORY  Chief Complaint  Otalgia and Facial Pain   HPI Yvonne Brown is a 31 y.o. female is a complaint of facial pain and bilateral ear pain.Patient states that she has had sinus pain for approximately 2 months. She states it is been worse in the past week. Patient is unaware of any fever and denies chills. She states that there is more pressure in her ears today then she has experienced. Patient has been seen at an urgent care but not treated with anything that has helped. She states that she has been placed on Zithromax twice without improvement. Currently she rates her pain as 10 over 10.  History reviewed. No pertinent past medical history.  There are no active problems to display for this patient.   Past Surgical History:  Procedure Laterality Date  . TONSILLECTOMY AND ADENOIDECTOMY      Prior to Admission medications   Medication Sig Start Date End Date Taking? Authorizing Provider  amoxicillin-clavulanate (AUGMENTIN) 875-125 MG tablet Take 1 tablet by mouth 2 (two) times daily. 01/18/17 02/08/17  Tommi Rumps, PA-C  fluticasone (FLONASE) 50 MCG/ACT nasal spray Place 2 sprays into both nostrils daily. 01/18/17 01/18/18  Tommi Rumps, PA-C  Norgestimate-Ethinyl Estradiol Triphasic (TRI-SPRINTEC) 0.18/0.215/0.25 MG-35 MCG tablet Take 1 tablet by mouth daily.    [provider]  predniSONE (DELTASONE) 10 MG tablet Take 6 tablets  today, on day 2 take 5 tablets, day 3 take 4 tablets, day 4 take 3 tablets, day 5 take  2 tablets and 1 tablet the last day 01/18/17   Tommi Rumps, PA-C    Allergies Patient has no known allergies.  No family history on file.  Social History Social History  Substance Use Topics  . Smoking status: Never Smoker  . Smokeless  tobacco: Never Used  . Alcohol use Yes    Review of Systems  Constitutional: Negative for fever. Eyes: Negative for visual changes. ENT: Negative for sore throat. Positive for bilateral ear pain. Positive for sinus pain. Cardiovascular: Negative for chest pain. Respiratory: Negative for shortness of breath. Musculoskeletal: Negative for back pain. Skin: Negative for rash. Neurological: Negative for headaches, focal weakness or numbness. ____________________________________________  PHYSICAL EXAM:  VITAL SIGNS: ED Triage Vitals [01/18/17 0917]  Enc Vitals Group     BP 130/85     Pulse Rate 72     Resp 20     Temp 98.5 F (36.9 C)     Temp Source Oral     SpO2      Weight 215 lb (97.5 kg)     Height 5\' 7"  (1.702 m)     Head Circumference      Peak Flow      Pain Score 10     Pain Loc      Pain Edu?      Excl. in GC?     Constitutional: Alert and oriented. Well appearing and in no distress. Head: Normocephalic and atraumatic. Eyes: Conjunctivae are normal. PERRL. Normal extraocular movements Ears: Canals clear. TMs intact bilaterally. TMs are dull bilaterally with poor light reflex. Nose: Mild congestion/rhinorrhea/epistaxis.  Positive tenderness to percussion frontal and maxillary sinuses bilaterally. Mouth/Throat: Mucous membranes are moist. Positive posterior drainage. Neck: Supple. No thyromegaly. Hematological/Lymphatic/Immunological: No cervical lymphadenopathy. Cardiovascular: Normal rate, regular rhythm. Normal distal pulses. Respiratory: Normal respiratory effort. No  wheezes/rales/rhonchi. Musculoskeletal: Nontender with normal range of motion in all extremities.  Neurologic:  Normal gait without ataxia. Normal speech and language. No gross focal neurologic deficits are appreciated. Skin:  Skin is warm, dry and intact. No rash noted. Psychiatric: Mood and affect are normal. Patient exhibits appropriate insight and  judgment. ____________________________________________  INITIAL IMPRESSION / ASSESSMENT AND PLAN / ED COURSE  Patient is been on Zithromax twice without any relief of her sinus pain. Patient was started on Augmentin 875 twice a day for 14 days. She is also given a prescription for Flonase nasal spray and prednisone taper. She is to follow-up with Dr. Elenore RotaJuengel who is the ENT on call today for her recurrent sinus pain..    ____________________________________________  FINAL CLINICAL IMPRESSION(S) / ED DIAGNOSES  Final diagnoses:  Acute recurrent pansinusitis     Tommi RumpsSummers, Mylen Mangan L, PA-C 01/18/17 1422    Emily FilbertWilliams, Jonathan E, MD 01/18/17 [phone]

## 2017-01-20 DIAGNOSIS — R51 Headache: Secondary | ICD-10-CM | POA: Diagnosis not present

## 2017-01-20 DIAGNOSIS — H9313 Tinnitus, bilateral: Secondary | ICD-10-CM | POA: Diagnosis not present

## 2017-01-20 DIAGNOSIS — J019 Acute sinusitis, unspecified: Secondary | ICD-10-CM | POA: Diagnosis not present

## 2017-01-22 DIAGNOSIS — R51 Headache: Secondary | ICD-10-CM | POA: Diagnosis not present

## 2017-01-22 DIAGNOSIS — H9319 Tinnitus, unspecified ear: Secondary | ICD-10-CM | POA: Diagnosis not present

## 2017-01-22 DIAGNOSIS — H9313 Tinnitus, bilateral: Secondary | ICD-10-CM | POA: Diagnosis not present

## 2017-01-25 DIAGNOSIS — R51 Headache: Secondary | ICD-10-CM | POA: Diagnosis not present

## 2017-01-25 DIAGNOSIS — J019 Acute sinusitis, unspecified: Secondary | ICD-10-CM | POA: Diagnosis not present

## 2017-01-25 DIAGNOSIS — J329 Chronic sinusitis, unspecified: Secondary | ICD-10-CM | POA: Diagnosis not present

## 2017-01-25 DIAGNOSIS — J342 Deviated nasal septum: Secondary | ICD-10-CM | POA: Diagnosis not present

## 2017-01-26 DIAGNOSIS — M9901 Segmental and somatic dysfunction of cervical region: Secondary | ICD-10-CM | POA: Diagnosis not present

## 2017-01-26 DIAGNOSIS — R51 Headache: Secondary | ICD-10-CM | POA: Diagnosis not present

## 2017-01-26 DIAGNOSIS — M9902 Segmental and somatic dysfunction of thoracic region: Secondary | ICD-10-CM | POA: Diagnosis not present

## 2017-01-27 DIAGNOSIS — R51 Headache: Secondary | ICD-10-CM | POA: Diagnosis not present

## 2017-01-27 DIAGNOSIS — M50321 Other cervical disc degeneration at C4-C5 level: Secondary | ICD-10-CM | POA: Diagnosis not present

## 2017-02-01 DIAGNOSIS — J329 Chronic sinusitis, unspecified: Secondary | ICD-10-CM | POA: Diagnosis not present

## 2017-02-01 DIAGNOSIS — J342 Deviated nasal septum: Secondary | ICD-10-CM | POA: Diagnosis not present

## 2017-02-01 DIAGNOSIS — R51 Headache: Secondary | ICD-10-CM | POA: Diagnosis not present

## 2017-02-02 DIAGNOSIS — M9901 Segmental and somatic dysfunction of cervical region: Secondary | ICD-10-CM | POA: Diagnosis not present

## 2017-02-02 DIAGNOSIS — M9902 Segmental and somatic dysfunction of thoracic region: Secondary | ICD-10-CM | POA: Diagnosis not present

## 2017-02-02 DIAGNOSIS — R51 Headache: Secondary | ICD-10-CM | POA: Diagnosis not present

## 2017-02-11 DIAGNOSIS — Z79899 Other long term (current) drug therapy: Secondary | ICD-10-CM | POA: Diagnosis not present

## 2017-02-11 DIAGNOSIS — R51 Headache: Secondary | ICD-10-CM | POA: Diagnosis not present

## 2017-02-23 DIAGNOSIS — R51 Headache: Secondary | ICD-10-CM | POA: Diagnosis not present

## 2017-02-23 DIAGNOSIS — M9903 Segmental and somatic dysfunction of lumbar region: Secondary | ICD-10-CM | POA: Diagnosis not present

## 2017-02-23 DIAGNOSIS — M9902 Segmental and somatic dysfunction of thoracic region: Secondary | ICD-10-CM | POA: Diagnosis not present

## 2017-02-23 DIAGNOSIS — M9901 Segmental and somatic dysfunction of cervical region: Secondary | ICD-10-CM | POA: Diagnosis not present

## 2017-04-13 DIAGNOSIS — M9902 Segmental and somatic dysfunction of thoracic region: Secondary | ICD-10-CM | POA: Diagnosis not present

## 2017-04-13 DIAGNOSIS — M9901 Segmental and somatic dysfunction of cervical region: Secondary | ICD-10-CM | POA: Diagnosis not present

## 2017-04-13 DIAGNOSIS — M9903 Segmental and somatic dysfunction of lumbar region: Secondary | ICD-10-CM | POA: Diagnosis not present

## 2017-04-13 DIAGNOSIS — R51 Headache: Secondary | ICD-10-CM | POA: Diagnosis not present

## 2017-05-04 DIAGNOSIS — R51 Headache: Secondary | ICD-10-CM | POA: Diagnosis not present

## 2017-05-04 DIAGNOSIS — M9901 Segmental and somatic dysfunction of cervical region: Secondary | ICD-10-CM | POA: Diagnosis not present

## 2017-05-04 DIAGNOSIS — M9902 Segmental and somatic dysfunction of thoracic region: Secondary | ICD-10-CM | POA: Diagnosis not present

## 2017-05-04 DIAGNOSIS — M9903 Segmental and somatic dysfunction of lumbar region: Secondary | ICD-10-CM | POA: Diagnosis not present

## 2017-06-04 IMAGING — CT CT HEAD W/O CM
3 series · 14 of 43 positions shown, 16 images · non-contrast
Comparison: None.

CLINICAL DATA: Headaches for the past month.  Slight nausea.

EXAM:
CT HEAD WITHOUT CONTRAST
TECHNIQUE: Contiguous axial images were obtained from the base of the skull
through the vertex without intravenous contrast.

[Series 2: ax head wo · axial · 0.36mm/px · z∈[-17,+87]mm · 8 of 26 slices shown, 10 images]
[im 3/26  brain]
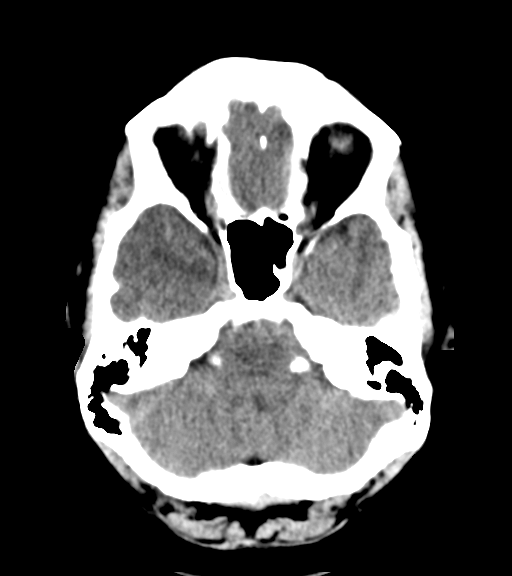
[im 3/26  bone]
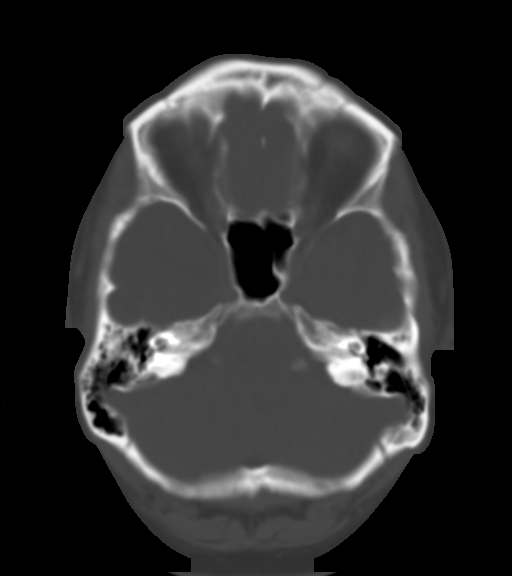
[im 6/26  brain]
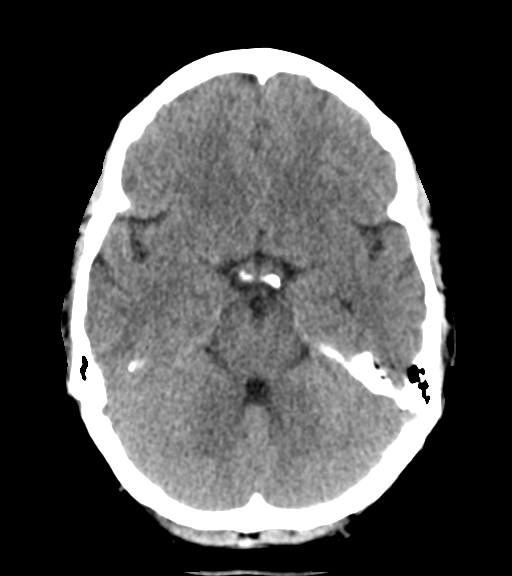
[im 9/26  brain]
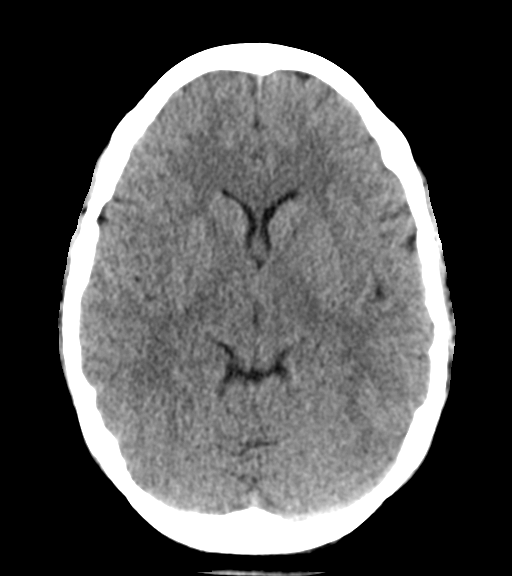
[im 12/26  brain]
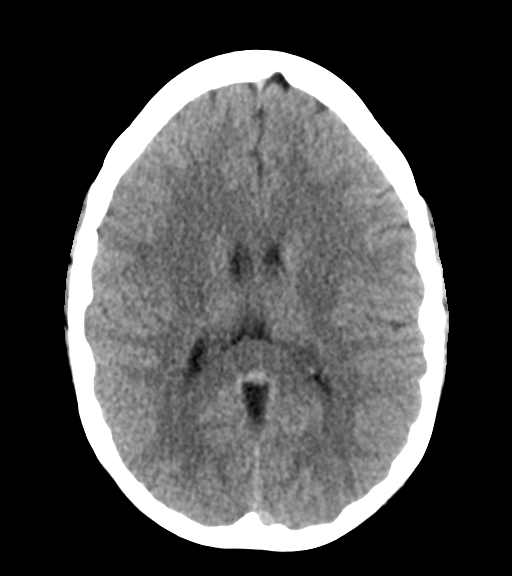
[im 15/26  brain]
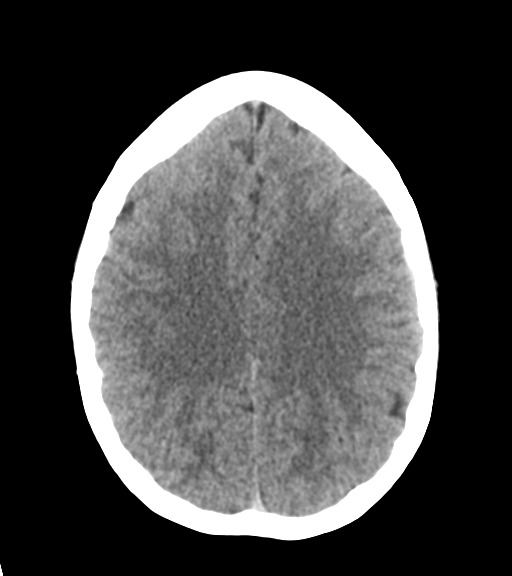
[im 15/26  bone]
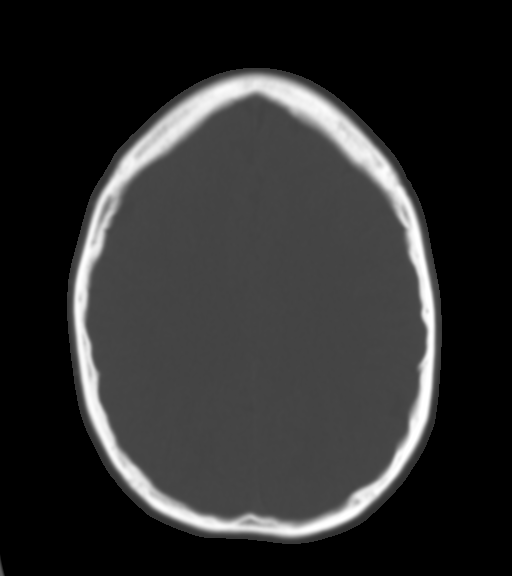
[im 18/26  brain]
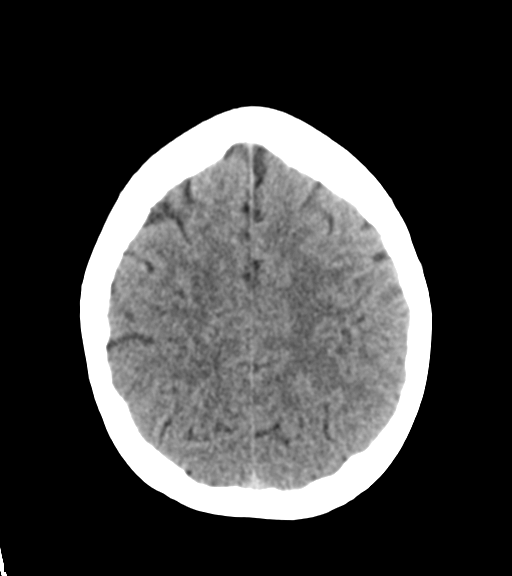
[im 21/26  brain]
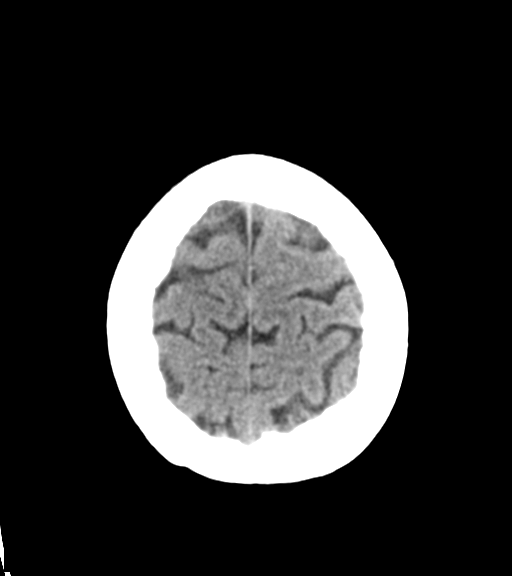
[im 24/26  brain]
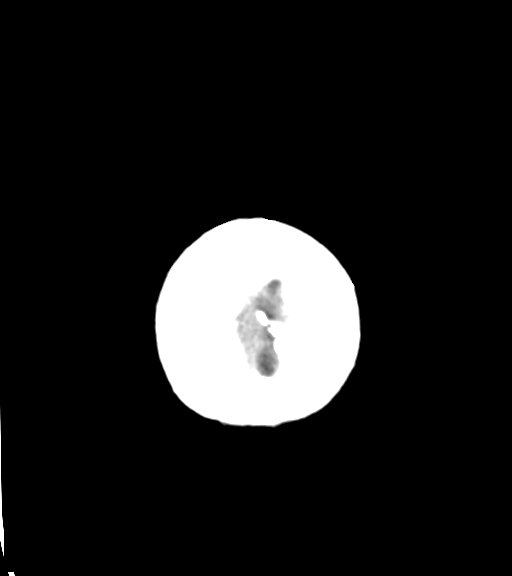

[Series 5: coronal soft tissue · coronal · 0.28mm/px · 3 of 60 slices shown]
[im 20/60  brain]
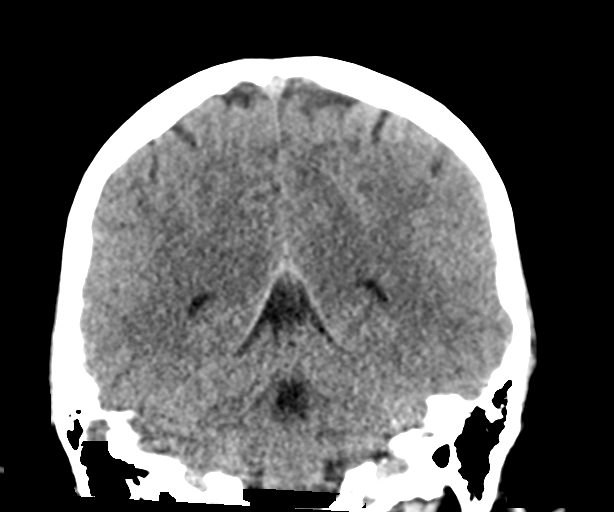
[im 27/60  brain]
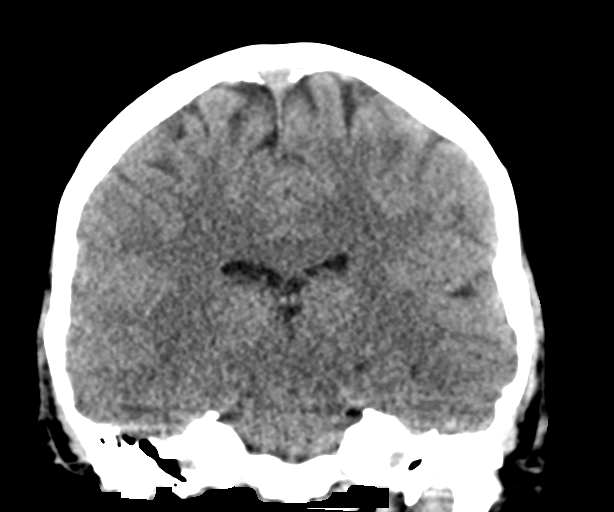
[im 33/60  brain]
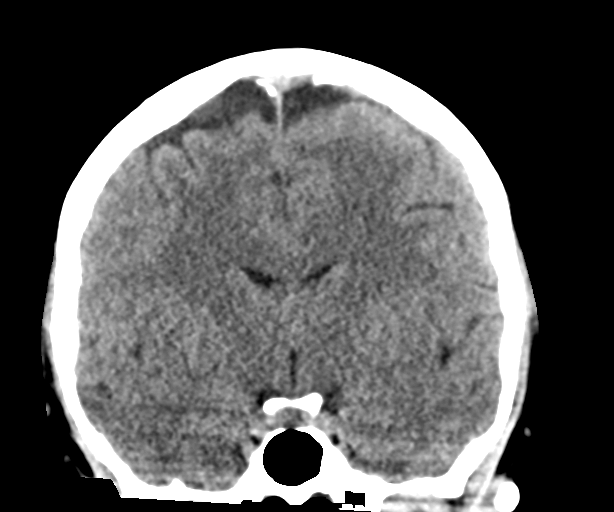

[Series 6: sagittal soft tissue · sagittal · 0.29mm/px · 3 of 48 slices shown]
[im 16/48  brain]
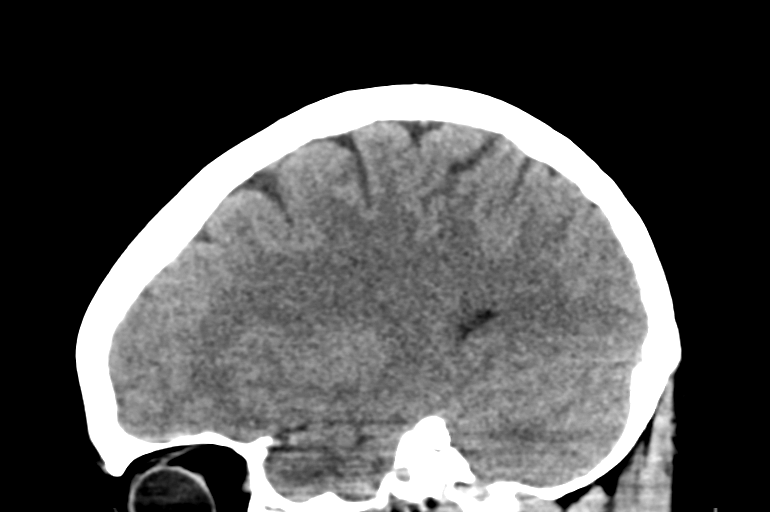
[im 24/48  brain]
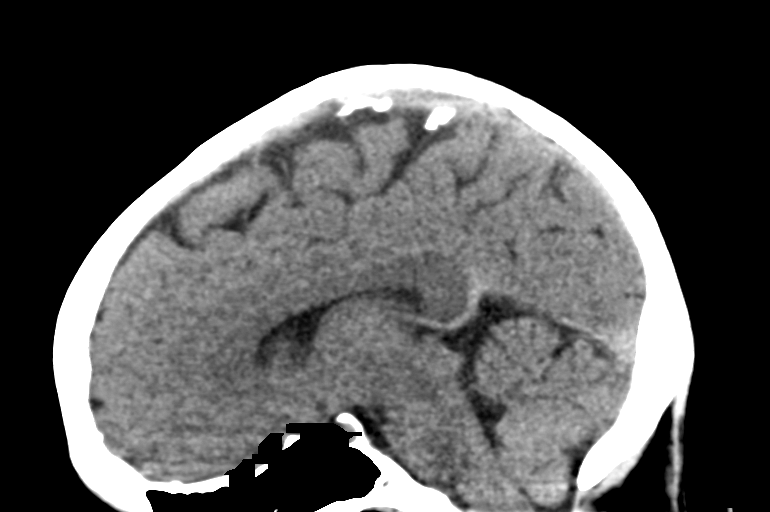
[im 32/48  brain]
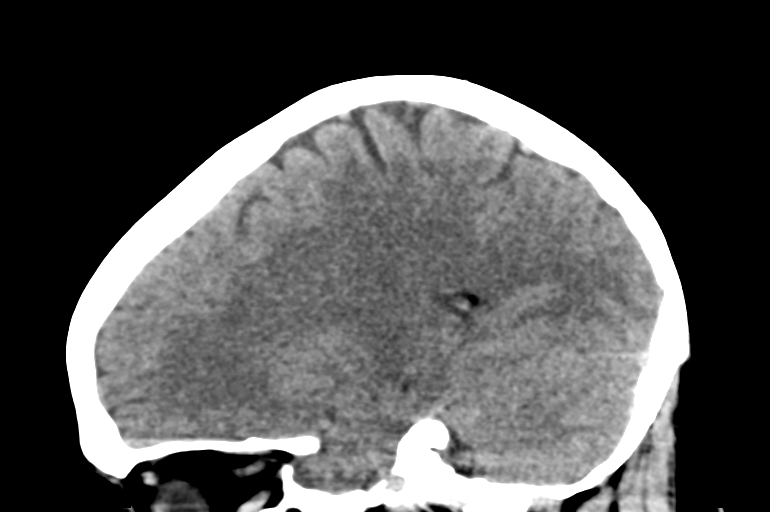

[14 of 43 positions shown; findings below may reference images not displayed]

FINDINGS: Brain: No evidence of acute infarction, hemorrhage, hydrocephalus,
extra-axial collection or mass lesion/mass effect.

Vascular: No hyperdense vessel or unexpected calcification.

Skull: Normal. Negative for fracture or focal lesion.

Sinuses/Orbits: No acute finding.

Other: None.
IMPRESSION: Normal examination.

## 2017-06-15 DIAGNOSIS — R51 Headache: Secondary | ICD-10-CM | POA: Diagnosis not present

## 2017-06-15 DIAGNOSIS — M9901 Segmental and somatic dysfunction of cervical region: Secondary | ICD-10-CM | POA: Diagnosis not present

## 2017-06-15 DIAGNOSIS — M9903 Segmental and somatic dysfunction of lumbar region: Secondary | ICD-10-CM | POA: Diagnosis not present

## 2017-06-15 DIAGNOSIS — M9902 Segmental and somatic dysfunction of thoracic region: Secondary | ICD-10-CM | POA: Diagnosis not present

## 2017-06-16 ENCOUNTER — Other Ambulatory Visit: Payer: Self-pay | Admitting: Obstetrics & Gynecology

## 2017-06-16 DIAGNOSIS — Z3009 Encounter for other general counseling and advice on contraception: Secondary | ICD-10-CM | POA: Diagnosis not present

## 2017-06-16 DIAGNOSIS — N6320 Unspecified lump in the left breast, unspecified quadrant: Secondary | ICD-10-CM | POA: Diagnosis not present

## 2017-06-16 DIAGNOSIS — N63 Unspecified lump in unspecified breast: Secondary | ICD-10-CM

## 2017-06-21 ENCOUNTER — Encounter: Payer: Self-pay | Admitting: Radiology

## 2017-06-21 ENCOUNTER — Ambulatory Visit
Admission: RE | Admit: 2017-06-21 | Discharge: 2017-06-21 | Disposition: A | Discharge status: Home / Self Care | Payer: BLUE CROSS/BLUE SHIELD | Source: Ambulatory Visit | Attending: Obstetrics & Gynecology | Admitting: Obstetrics & Gynecology

## 2017-06-21 DIAGNOSIS — N6323 Unspecified lump in the left breast, lower outer quadrant: Secondary | ICD-10-CM | POA: Diagnosis not present

## 2017-06-21 DIAGNOSIS — N63 Unspecified lump in unspecified breast: Secondary | ICD-10-CM

## 2017-06-21 DIAGNOSIS — N6324 Unspecified lump in the left breast, lower inner quadrant: Secondary | ICD-10-CM | POA: Diagnosis not present

## 2017-06-29 DIAGNOSIS — M9902 Segmental and somatic dysfunction of thoracic region: Secondary | ICD-10-CM | POA: Diagnosis not present

## 2017-06-29 DIAGNOSIS — M9903 Segmental and somatic dysfunction of lumbar region: Secondary | ICD-10-CM | POA: Diagnosis not present

## 2017-06-29 DIAGNOSIS — M9901 Segmental and somatic dysfunction of cervical region: Secondary | ICD-10-CM | POA: Diagnosis not present

## 2017-06-29 DIAGNOSIS — R51 Headache: Secondary | ICD-10-CM | POA: Diagnosis not present

## 2017-07-02 DIAGNOSIS — M792 Neuralgia and neuritis, unspecified: Secondary | ICD-10-CM | POA: Diagnosis not present

## 2017-07-02 DIAGNOSIS — G501 Atypical facial pain: Secondary | ICD-10-CM | POA: Diagnosis not present

## 2017-07-02 DIAGNOSIS — H9203 Otalgia, bilateral: Secondary | ICD-10-CM | POA: Diagnosis not present

## 2017-07-13 DIAGNOSIS — M9902 Segmental and somatic dysfunction of thoracic region: Secondary | ICD-10-CM | POA: Diagnosis not present

## 2017-07-13 DIAGNOSIS — M9903 Segmental and somatic dysfunction of lumbar region: Secondary | ICD-10-CM | POA: Diagnosis not present

## 2017-07-13 DIAGNOSIS — R51 Headache: Secondary | ICD-10-CM | POA: Diagnosis not present

## 2017-07-13 DIAGNOSIS — M9901 Segmental and somatic dysfunction of cervical region: Secondary | ICD-10-CM | POA: Diagnosis not present

## 2017-07-27 DIAGNOSIS — M9903 Segmental and somatic dysfunction of lumbar region: Secondary | ICD-10-CM | POA: Diagnosis not present

## 2017-07-27 DIAGNOSIS — M9902 Segmental and somatic dysfunction of thoracic region: Secondary | ICD-10-CM | POA: Diagnosis not present

## 2017-07-27 DIAGNOSIS — R51 Headache: Secondary | ICD-10-CM | POA: Diagnosis not present

## 2017-07-27 DIAGNOSIS — M9901 Segmental and somatic dysfunction of cervical region: Secondary | ICD-10-CM | POA: Diagnosis not present

## 2017-08-17 DIAGNOSIS — R51 Headache: Secondary | ICD-10-CM | POA: Diagnosis not present

## 2017-08-17 DIAGNOSIS — M9903 Segmental and somatic dysfunction of lumbar region: Secondary | ICD-10-CM | POA: Diagnosis not present

## 2017-08-17 DIAGNOSIS — M9902 Segmental and somatic dysfunction of thoracic region: Secondary | ICD-10-CM | POA: Diagnosis not present

## 2017-08-17 DIAGNOSIS — M9901 Segmental and somatic dysfunction of cervical region: Secondary | ICD-10-CM | POA: Diagnosis not present

## 2017-08-31 NOTE — L&D Delivery Note (Signed)
Delivery Note At 12:46 PM a viable female was delivered via Vaginal, Spontaneous (Presentation: OA  ).  APGAR: 9, 9; weight pending.   Placenta status: to L&D.  Cord:  with the following complications: none .  Cord pH: n/a  Anesthesia:  epidural Episiotomy: Right Mediolateral Lacerations: None Suture Repair: 2.0 3.0 vicryl Est. Blood Loss (mL): 261  Mom to postpartum.  Baby to Couplet care / Skin to Skin.  Alessandra BevelsJennifer M Kimberley Speece 08/14/2018, 1:05 PM

## 2017-09-07 DIAGNOSIS — R51 Headache: Secondary | ICD-10-CM | POA: Diagnosis not present

## 2017-09-07 DIAGNOSIS — M9902 Segmental and somatic dysfunction of thoracic region: Secondary | ICD-10-CM | POA: Diagnosis not present

## 2017-09-07 DIAGNOSIS — M9903 Segmental and somatic dysfunction of lumbar region: Secondary | ICD-10-CM | POA: Diagnosis not present

## 2017-09-07 DIAGNOSIS — M9901 Segmental and somatic dysfunction of cervical region: Secondary | ICD-10-CM | POA: Diagnosis not present

## 2017-09-21 DIAGNOSIS — M9902 Segmental and somatic dysfunction of thoracic region: Secondary | ICD-10-CM | POA: Diagnosis not present

## 2017-09-21 DIAGNOSIS — R51 Headache: Secondary | ICD-10-CM | POA: Diagnosis not present

## 2017-09-21 DIAGNOSIS — M9903 Segmental and somatic dysfunction of lumbar region: Secondary | ICD-10-CM | POA: Diagnosis not present

## 2017-09-21 DIAGNOSIS — M9901 Segmental and somatic dysfunction of cervical region: Secondary | ICD-10-CM | POA: Diagnosis not present

## 2017-09-22 DIAGNOSIS — M26609 Unspecified temporomandibular joint disorder, unspecified side: Secondary | ICD-10-CM | POA: Diagnosis not present

## 2017-09-22 DIAGNOSIS — Z6833 Body mass index (BMI) 33.0-33.9, adult: Secondary | ICD-10-CM | POA: Diagnosis not present

## 2017-10-05 DIAGNOSIS — M9902 Segmental and somatic dysfunction of thoracic region: Secondary | ICD-10-CM | POA: Diagnosis not present

## 2017-10-05 DIAGNOSIS — M9903 Segmental and somatic dysfunction of lumbar region: Secondary | ICD-10-CM | POA: Diagnosis not present

## 2017-10-05 DIAGNOSIS — R51 Headache: Secondary | ICD-10-CM | POA: Diagnosis not present

## 2017-10-05 DIAGNOSIS — M9901 Segmental and somatic dysfunction of cervical region: Secondary | ICD-10-CM | POA: Diagnosis not present

## 2017-10-14 DIAGNOSIS — G501 Atypical facial pain: Secondary | ICD-10-CM | POA: Diagnosis not present

## 2017-10-14 DIAGNOSIS — F458 Other somatoform disorders: Secondary | ICD-10-CM | POA: Diagnosis not present

## 2017-10-19 DIAGNOSIS — M9903 Segmental and somatic dysfunction of lumbar region: Secondary | ICD-10-CM | POA: Diagnosis not present

## 2017-10-19 DIAGNOSIS — M9901 Segmental and somatic dysfunction of cervical region: Secondary | ICD-10-CM | POA: Diagnosis not present

## 2017-10-19 DIAGNOSIS — R51 Headache: Secondary | ICD-10-CM | POA: Diagnosis not present

## 2017-10-19 DIAGNOSIS — M9902 Segmental and somatic dysfunction of thoracic region: Secondary | ICD-10-CM | POA: Diagnosis not present

## 2017-10-22 DIAGNOSIS — Z0001 Encounter for general adult medical examination with abnormal findings: Secondary | ICD-10-CM | POA: Diagnosis not present

## 2017-10-22 DIAGNOSIS — R51 Headache: Secondary | ICD-10-CM | POA: Diagnosis not present

## 2017-11-01 DIAGNOSIS — Z1331 Encounter for screening for depression: Secondary | ICD-10-CM | POA: Diagnosis not present

## 2017-11-01 DIAGNOSIS — Z0000 Encounter for general adult medical examination without abnormal findings: Secondary | ICD-10-CM | POA: Diagnosis not present

## 2017-11-01 DIAGNOSIS — Z6833 Body mass index (BMI) 33.0-33.9, adult: Secondary | ICD-10-CM | POA: Diagnosis not present

## 2017-11-04 DIAGNOSIS — M9902 Segmental and somatic dysfunction of thoracic region: Secondary | ICD-10-CM | POA: Diagnosis not present

## 2017-11-04 DIAGNOSIS — M9903 Segmental and somatic dysfunction of lumbar region: Secondary | ICD-10-CM | POA: Diagnosis not present

## 2017-11-04 DIAGNOSIS — M9901 Segmental and somatic dysfunction of cervical region: Secondary | ICD-10-CM | POA: Diagnosis not present

## 2017-11-04 DIAGNOSIS — R51 Headache: Secondary | ICD-10-CM | POA: Diagnosis not present

## 2017-11-16 DIAGNOSIS — R51 Headache: Secondary | ICD-10-CM | POA: Diagnosis not present

## 2017-11-16 DIAGNOSIS — M9902 Segmental and somatic dysfunction of thoracic region: Secondary | ICD-10-CM | POA: Diagnosis not present

## 2017-11-16 DIAGNOSIS — M9903 Segmental and somatic dysfunction of lumbar region: Secondary | ICD-10-CM | POA: Diagnosis not present

## 2017-11-16 DIAGNOSIS — M9901 Segmental and somatic dysfunction of cervical region: Secondary | ICD-10-CM | POA: Diagnosis not present

## 2017-11-19 DIAGNOSIS — N898 Other specified noninflammatory disorders of vagina: Secondary | ICD-10-CM | POA: Diagnosis not present

## 2017-11-19 DIAGNOSIS — R300 Dysuria: Secondary | ICD-10-CM | POA: Diagnosis not present

## 2017-11-19 DIAGNOSIS — R102 Pelvic and perineal pain: Secondary | ICD-10-CM | POA: Diagnosis not present

## 2017-11-19 DIAGNOSIS — Z3009 Encounter for other general counseling and advice on contraception: Secondary | ICD-10-CM | POA: Diagnosis not present

## 2017-11-30 DIAGNOSIS — M9901 Segmental and somatic dysfunction of cervical region: Secondary | ICD-10-CM | POA: Diagnosis not present

## 2017-11-30 DIAGNOSIS — M9902 Segmental and somatic dysfunction of thoracic region: Secondary | ICD-10-CM | POA: Diagnosis not present

## 2017-11-30 DIAGNOSIS — R51 Headache: Secondary | ICD-10-CM | POA: Diagnosis not present

## 2017-11-30 DIAGNOSIS — M9903 Segmental and somatic dysfunction of lumbar region: Secondary | ICD-10-CM | POA: Diagnosis not present

## 2017-12-14 DIAGNOSIS — M9902 Segmental and somatic dysfunction of thoracic region: Secondary | ICD-10-CM | POA: Diagnosis not present

## 2017-12-14 DIAGNOSIS — R51 Headache: Secondary | ICD-10-CM | POA: Diagnosis not present

## 2017-12-14 DIAGNOSIS — M9901 Segmental and somatic dysfunction of cervical region: Secondary | ICD-10-CM | POA: Diagnosis not present

## 2017-12-14 DIAGNOSIS — M9903 Segmental and somatic dysfunction of lumbar region: Secondary | ICD-10-CM | POA: Diagnosis not present

## 2017-12-20 DIAGNOSIS — Z3481 Encounter for supervision of other normal pregnancy, first trimester: Secondary | ICD-10-CM | POA: Diagnosis not present

## 2017-12-23 DIAGNOSIS — T7840XA Allergy, unspecified, initial encounter: Secondary | ICD-10-CM | POA: Diagnosis not present

## 2017-12-23 DIAGNOSIS — R51 Headache: Secondary | ICD-10-CM | POA: Diagnosis not present

## 2017-12-23 LAB — OB RESULTS CONSOLE RPR: RPR: NONREACTIVE

## 2017-12-23 LAB — OB RESULTS CONSOLE GC/CHLAMYDIA
Chlamydia: NEGATIVE
Gonorrhea: NEGATIVE

## 2017-12-23 LAB — OB RESULTS CONSOLE HIV ANTIBODY (ROUTINE TESTING): HIV: NONREACTIVE

## 2017-12-23 LAB — OB RESULTS CONSOLE ANTIBODY SCREEN: Antibody Screen: NEGATIVE

## 2017-12-23 LAB — OB RESULTS CONSOLE RUBELLA ANTIBODY, IGM: Rubella: IMMUNE

## 2017-12-23 LAB — OB RESULTS CONSOLE ABO/RH: RH Type: POSITIVE

## 2017-12-23 LAB — OB RESULTS CONSOLE HEPATITIS B SURFACE ANTIGEN: Hepatitis B Surface Ag: NEGATIVE

## 2017-12-28 DIAGNOSIS — R51 Headache: Secondary | ICD-10-CM | POA: Diagnosis not present

## 2017-12-28 DIAGNOSIS — M9901 Segmental and somatic dysfunction of cervical region: Secondary | ICD-10-CM | POA: Diagnosis not present

## 2017-12-28 DIAGNOSIS — M9903 Segmental and somatic dysfunction of lumbar region: Secondary | ICD-10-CM | POA: Diagnosis not present

## 2017-12-28 DIAGNOSIS — M9902 Segmental and somatic dysfunction of thoracic region: Secondary | ICD-10-CM | POA: Diagnosis not present

## 2018-01-17 DIAGNOSIS — Z3482 Encounter for supervision of other normal pregnancy, second trimester: Secondary | ICD-10-CM | POA: Diagnosis not present

## 2018-01-18 DIAGNOSIS — Z3A10 10 weeks gestation of pregnancy: Secondary | ICD-10-CM | POA: Diagnosis not present

## 2018-01-18 DIAGNOSIS — O26899 Other specified pregnancy related conditions, unspecified trimester: Secondary | ICD-10-CM | POA: Diagnosis not present

## 2018-01-25 DIAGNOSIS — M9901 Segmental and somatic dysfunction of cervical region: Secondary | ICD-10-CM | POA: Diagnosis not present

## 2018-01-25 DIAGNOSIS — M9902 Segmental and somatic dysfunction of thoracic region: Secondary | ICD-10-CM | POA: Diagnosis not present

## 2018-01-25 DIAGNOSIS — R51 Headache: Secondary | ICD-10-CM | POA: Diagnosis not present

## 2018-01-25 DIAGNOSIS — M9903 Segmental and somatic dysfunction of lumbar region: Secondary | ICD-10-CM | POA: Diagnosis not present

## 2018-01-26 DIAGNOSIS — Z6831 Body mass index (BMI) 31.0-31.9, adult: Secondary | ICD-10-CM | POA: Diagnosis not present

## 2018-01-26 DIAGNOSIS — J069 Acute upper respiratory infection, unspecified: Secondary | ICD-10-CM | POA: Diagnosis not present

## 2018-03-15 DIAGNOSIS — Z3482 Encounter for supervision of other normal pregnancy, second trimester: Secondary | ICD-10-CM | POA: Diagnosis not present

## 2018-03-29 DIAGNOSIS — Z3A20 20 weeks gestation of pregnancy: Secondary | ICD-10-CM | POA: Diagnosis not present

## 2018-03-29 DIAGNOSIS — Z36 Encounter for antenatal screening for chromosomal anomalies: Secondary | ICD-10-CM | POA: Diagnosis not present

## 2018-04-01 DIAGNOSIS — J029 Acute pharyngitis, unspecified: Secondary | ICD-10-CM | POA: Diagnosis not present

## 2018-04-01 DIAGNOSIS — Z6832 Body mass index (BMI) 32.0-32.9, adult: Secondary | ICD-10-CM | POA: Diagnosis not present

## 2018-05-12 DIAGNOSIS — Z3482 Encounter for supervision of other normal pregnancy, second trimester: Secondary | ICD-10-CM | POA: Diagnosis not present

## 2018-05-12 DIAGNOSIS — N898 Other specified noninflammatory disorders of vagina: Secondary | ICD-10-CM | POA: Diagnosis not present

## 2018-05-13 DIAGNOSIS — Z3482 Encounter for supervision of other normal pregnancy, second trimester: Secondary | ICD-10-CM | POA: Diagnosis not present

## 2018-07-21 DIAGNOSIS — Z3483 Encounter for supervision of other normal pregnancy, third trimester: Secondary | ICD-10-CM | POA: Diagnosis not present

## 2018-07-21 LAB — OB RESULTS CONSOLE GBS: GBS: NEGATIVE

## 2018-08-03 ENCOUNTER — Inpatient Hospital Stay (HOSPITAL_COMMUNITY)
Admission: AD | Admit: 2018-08-03 | Discharge: 2018-08-03 | Disposition: A | Discharge status: Home / Self Care | Payer: BLUE CROSS/BLUE SHIELD | Source: Ambulatory Visit | Attending: Obstetrics & Gynecology | Admitting: Obstetrics & Gynecology

## 2018-08-03 ENCOUNTER — Encounter (HOSPITAL_COMMUNITY): Payer: Self-pay | Admitting: *Deleted

## 2018-08-03 DIAGNOSIS — R51 Headache: Secondary | ICD-10-CM | POA: Diagnosis not present

## 2018-08-03 DIAGNOSIS — R112 Nausea with vomiting, unspecified: Secondary | ICD-10-CM

## 2018-08-03 DIAGNOSIS — Z3A38 38 weeks gestation of pregnancy: Secondary | ICD-10-CM | POA: Diagnosis not present

## 2018-08-03 DIAGNOSIS — O212 Late vomiting of pregnancy: Secondary | ICD-10-CM | POA: Insufficient documentation

## 2018-08-03 DIAGNOSIS — O26893 Other specified pregnancy related conditions, third trimester: Secondary | ICD-10-CM | POA: Insufficient documentation

## 2018-08-03 HISTORY — DX: Headache: R51

## 2018-08-03 HISTORY — DX: Headache, unspecified: R51.9

## 2018-08-03 LAB — COMPREHENSIVE METABOLIC PANEL
ALT: 13 U/L (ref 0–44)
ANION GAP: 9 (ref 5–15)
AST: 18 U/L (ref 15–41)
Albumin: 3.3 g/dL — ABNORMAL LOW (ref 3.5–5.0)
Alkaline Phosphatase: 140 U/L — ABNORMAL HIGH (ref 38–126)
BUN: <5 mg/dL — ABNORMAL LOW (ref 6–20)
CHLORIDE: 103 mmol/L (ref 98–111)
CO2: 23 mmol/L (ref 22–32)
Calcium: 8.9 mg/dL (ref 8.9–10.3)
Creatinine, Ser: 0.44 mg/dL (ref 0.44–1.00)
GFR calc Af Amer: >60 mL/min (ref >60)
Glucose, Bld: 74 mg/dL (ref 70–99)
Potassium: 4.1 mmol/L (ref 3.5–5.1)
Sodium: 135 mmol/L (ref 135–145)
Total Bilirubin: 0.8 mg/dL (ref 0.3–1.2)
Total Protein: 6.5 g/dL (ref 6.5–8.1)

## 2018-08-03 LAB — CBC WITH DIFFERENTIAL/PLATELET
BASOS PCT: 0 %
Basophils Absolute: 0 10*3/uL (ref 0.0–0.1)
Eosinophils Absolute: 0.1 10*3/uL (ref 0.0–0.5)
Eosinophils Relative: 1 %
HCT: 39.6 % (ref 36.0–46.0)
Hemoglobin: 13.3 g/dL (ref 12.0–15.0)
Lymphocytes Relative: 25 %
Lymphs Abs: 2.3 10*3/uL (ref 0.7–4.0)
MCH: 29.8 pg (ref 26.0–34.0)
MCHC: 33.6 g/dL (ref 30.0–36.0)
MCV: 88.8 fL (ref 80.0–100.0)
Monocytes Absolute: 0.3 10*3/uL (ref 0.1–1.0)
Monocytes Relative: 3 %
Neutro Abs: 6.5 10*3/uL (ref 1.7–7.7)
Neutrophils Relative %: 71 %
Platelets: 254 10*3/uL (ref 150–400)
RBC: 4.46 MIL/uL (ref 3.87–5.11)
RDW: 14.1 % (ref 11.5–15.5)
WBC: 9.2 10*3/uL (ref 4.0–10.5)
nRBC: 0 % (ref 0.0–0.2)

## 2018-08-03 LAB — URINALYSIS, ROUTINE W REFLEX MICROSCOPIC
BILIRUBIN URINE: NEGATIVE
Glucose, UA: NEGATIVE mg/dL
Hgb urine dipstick: NEGATIVE
Ketones, ur: NEGATIVE mg/dL
Leukocytes, UA: NEGATIVE
NITRITE: NEGATIVE
PROTEIN: NEGATIVE mg/dL
Specific Gravity, Urine: 1.013 (ref 1.005–1.030)
pH: 7 (ref 5.0–8.0)

## 2018-08-03 MED ORDER — LACTATED RINGERS IV BOLUS
1000.0000 mL | Freq: Once | INTRAVENOUS | Status: AC
  Administered 2018-08-03: 1000 mL via INTRAVENOUS

## 2018-08-03 NOTE — MAU Note (Signed)
Headache and nausea since Saturday 

## 2018-08-03 NOTE — Discharge Instructions (Signed)
Third Trimester of Pregnancy The third trimester is from week 28 through week 40 (months 7 through 9). The third trimester is a time when the unborn baby (fetus) is growing rapidly. At the end of the ninth month, the fetus is about 20 inches in length and weighs 6-10 pounds. Body changes during your third trimester Your body will continue to go through many changes during pregnancy. The changes vary from woman to woman. During the third trimester:  Your weight will continue to increase. You can expect to gain 25-35 pounds (11-16 kg) by the end of the pregnancy.  You may begin to get stretch marks on your hips, abdomen, and breasts.  You may urinate more often because the fetus is moving lower into your pelvis and pressing on your bladder.  You may develop or continue to have heartburn. This is caused by increased hormones that slow down muscles in the digestive tract.  You may develop or continue to have constipation because increased hormones slow digestion and cause the muscles that push waste through your intestines to relax.  You may develop hemorrhoids. These are swollen veins (varicose veins) in the rectum that can itch or be painful.  You may develop swollen, bulging veins (varicose veins) in your legs.  You may have increased body aches in the pelvis, back, or thighs. This is due to weight gain and increased hormones that are relaxing your joints.  You may have changes in your hair. These can include thickening of your hair, rapid growth, and changes in texture. Some women also have hair loss during or after pregnancy, or hair that feels dry or thin. Your hair will most likely return to normal after your baby is born.  Your breasts will continue to grow and they will continue to become tender. A yellow fluid (colostrum) may leak from your breasts. This is the first milk you are producing for your baby.  Your belly button may stick out.  You may notice more swelling in your hands,  face, or ankles.  You may have increased tingling or numbness in your hands, arms, and legs. The skin on your belly may also feel numb.  You may feel short of breath because of your expanding uterus.  You may have more problems sleeping. This can be caused by the size of your belly, increased need to urinate, and an increase in your body's metabolism.  You may notice the fetus "dropping," or moving lower in your abdomen (lightening).  You may have increased vaginal discharge.  You may notice your joints feel loose and you may have pain around your pelvic bone.  What to expect at prenatal visits You will have prenatal exams every 2 weeks until week 36. Then you will have weekly prenatal exams. During a routine prenatal visit:  You will be weighed to make sure you and the baby are growing normally.  Your blood pressure will be taken.  Your abdomen will be measured to track your baby's growth.  The fetal heartbeat will be listened to.  Any test results from the previous visit will be discussed.  You may have a cervical check near your due date to see if your cervix has softened or thinned (effaced).  You will be tested for Group B streptococcus. This happens between 35 and 37 weeks.  Your health care provider may ask you:  What your birth plan is.  How you are feeling.  If you are feeling the baby move.  If you have had   any abnormal symptoms, such as leaking fluid, bleeding, severe headaches, or abdominal cramping.  If you are using any tobacco products, including cigarettes, chewing tobacco, and electronic cigarettes.  If you have any questions.  Other tests or screenings that may be performed during your third trimester include:  Blood tests that check for low iron levels (anemia).  Fetal testing to check the health, activity level, and growth of the fetus. Testing is done if you have certain medical conditions or if there are problems during the  pregnancy.  Nonstress test (NST). This test checks the health of your baby to make sure there are no signs of problems, such as the baby not getting enough oxygen. During this test, a belt is placed around your belly. The baby is made to move, and its heart rate is monitored during movement.  What is false labor? False labor is a condition in which you feel small, irregular tightenings of the muscles in the womb (contractions) that usually go away with rest, changing position, or drinking water. These are called Braxton Hicks contractions. Contractions may last for hours, days, or even weeks before true labor sets in. If contractions come at regular intervals, become more frequent, increase in intensity, or become painful, you should see your health care provider. What are the signs of labor?  Abdominal cramps.  Regular contractions that start at 10 minutes apart and become stronger and more frequent with time.  Contractions that start on the top of the uterus and spread down to the lower abdomen and back.  Increased pelvic pressure and dull back pain.  A watery or bloody mucus discharge that comes from the vagina.  Leaking of amniotic fluid. This is also known as your "water breaking." It could be a slow trickle or a gush. Let your health care provider know if it has a color or strange odor. If you have any of these signs, call your health care provider right away, even if it is before your due date. Follow these instructions at home: Medicines  Follow your health care provider's instructions regarding medicine use. Specific medicines may be either safe or unsafe to take during pregnancy.  Take a prenatal vitamin that contains at least 600 micrograms (mcg) of folic acid.  If you develop constipation, try taking a stool softener if your health care provider approves. Eating and drinking  Eat a balanced diet that includes fresh fruits and vegetables, whole grains, good sources of protein  such as meat, eggs, or tofu, and low-fat dairy. Your health care provider will help you determine the amount of weight gain that is right for you.  Avoid raw meat and uncooked cheese. These carry germs that can cause birth defects in the baby.  If you have low calcium intake from food, talk to your health care provider about whether you should take a daily calcium supplement.  Eat four or five small meals rather than three large meals a day.  Limit foods that are high in fat and processed sugars, such as fried and sweet foods.  To prevent constipation: ? Drink enough fluid to keep your urine clear or pale yellow. ? Eat foods that are high in fiber, such as fresh fruits and vegetables, whole grains, and beans. Activity  Exercise only as directed by your health care provider. Most women can continue their usual exercise routine during pregnancy. Try to exercise for 30 minutes at least 5 days a week. Stop exercising if you experience uterine contractions.  Avoid heavy   lifting.  Do not exercise in extreme heat or humidity, or at high altitudes.  Wear low-heel, comfortable shoes.  Practice good posture.  You may continue to have sex unless your health care provider tells you otherwise. Relieving pain and discomfort  Take frequent breaks and rest with your legs elevated if you have leg cramps or low back pain.  Take warm sitz baths to soothe any pain or discomfort caused by hemorrhoids. Use hemorrhoid cream if your health care provider approves.  Wear a good support bra to prevent discomfort from breast tenderness.  If you develop varicose veins: ? Wear support pantyhose or compression stockings as told by your healthcare provider. ? Elevate your feet for 15 minutes, 3-4 times a day. Prenatal care  Write down your questions. Take them to your prenatal visits.  Keep all your prenatal visits as told by your health care provider. This is important. Safety  Wear your seat belt at  all times when driving.  Make a list of emergency phone numbers, including numbers for family, friends, the hospital, and police and fire departments. General instructions  Avoid cat litter boxes and soil used by cats. These carry germs that can cause birth defects in the baby. If you have a cat, ask someone to clean the litter box for you.  Do not travel far distances unless it is absolutely necessary and only with the approval of your health care provider.  Do not use hot tubs, steam rooms, or saunas.  Do not drink alcohol.  Do not use any products that contain nicotine or tobacco, such as cigarettes and e-cigarettes. If you need help quitting, ask your health care provider.  Do not use any medicinal herbs or unprescribed drugs. These chemicals affect the formation and growth of the baby.  Do not douche or use tampons or scented sanitary pads.  Do not cross your legs for long periods of time.  To prepare for the arrival of your baby: ? Take prenatal classes to understand, practice, and ask questions about labor and delivery. ? Make a trial run to the hospital. ? Visit the hospital and tour the maternity area. ? Arrange for maternity or paternity leave through employers. ? Arrange for family and friends to take care of pets while you are in the hospital. ? Purchase a rear-facing car seat and make sure you know how to install it in your car. ? Pack your hospital bag. ? Prepare the baby's nursery. Make sure to remove all pillows and stuffed animals from the baby's crib to prevent suffocation.  Visit your dentist if you have not gone during your pregnancy. Use a soft toothbrush to brush your teeth and be gentle when you floss. Contact a health care provider if:  You are unsure if you are in labor or if your water has broken.  You become dizzy.  You have mild pelvic cramps, pelvic pressure, or nagging pain in your abdominal area.  You have lower back pain.  You have persistent  nausea, vomiting, or diarrhea.  You have an unusual or bad smelling vaginal discharge.  You have pain when you urinate. Get help right away if:  Your water breaks before 37 weeks.  You have regular contractions less than 5 minutes apart before 37 weeks.  You have a fever.  You are leaking fluid from your vagina.  You have spotting or bleeding from your vagina.  You have severe abdominal pain or cramping.  You have rapid weight loss or weight gain.    You have shortness of breath with chest pain.  You notice sudden or extreme swelling of your face, hands, ankles, feet, or legs.  Your baby makes fewer than 10 movements in 2 hours.  You have severe headaches that do not go away when you take medicine.  You have vision changes. Summary  The third trimester is from week 28 through week 40, months 7 through 9. The third trimester is a time when the unborn baby (fetus) is growing rapidly.  During the third trimester, your discomfort may increase as you and your baby continue to gain weight. You may have abdominal, leg, and back pain, sleeping problems, and an increased need to urinate.  During the third trimester your breasts will keep growing and they will continue to become tender. A yellow fluid (colostrum) may leak from your breasts. This is the first milk you are producing for your baby.  False labor is a condition in which you feel small, irregular tightenings of the muscles in the womb (contractions) that eventually go away. These are called Braxton Hicks contractions. Contractions may last for hours, days, or even weeks before true labor sets in.  Signs of labor can include: abdominal cramps; regular contractions that start at 10 minutes apart and become stronger and more frequent with time; watery or bloody mucus discharge that comes from the vagina; increased pelvic pressure and dull back pain; and leaking of amniotic fluid. This information is not intended to replace advice  given to you by your health care provider. Make sure you discuss any questions you have with your health care provider. Document Released: 08/11/2001 Document Revised: 01/23/2016 Document Reviewed: 10/18/2012 Elsevier Interactive Patient Education  2017 [employer]  

## 2018-08-03 NOTE — MAU Provider Note (Signed)
History     CSN: [id number]  Arrival date and time: 08/03/18 4098   First Provider Initiated Contact with Patient 08/03/18 1036      Chief Complaint  Patient presents with  . Headache  . Nausea   Yvonne Brown is a 32 y.o. G2P1001 at [redacted]w[redacted]d who presents today with headache and nausea. She states that on 07/30/18 she ate out and since then she has had nausea, vomiting and a headache. She called the office and was told to come in. She denies any complications with this pregnancy.   Headache   This is a new problem. The current episode started in the past 7 days. The problem occurs constantly. The problem has been unchanged. The pain is located in the occipital region. The pain does not radiate. The pain quality is similar to prior headaches (history of migraines ). The pain is at a severity of 5/10. Associated symptoms include nausea and vomiting. Pertinent negatives include no fever. Nothing aggravates the symptoms. She has tried acetaminophen for the symptoms. The treatment provided no relief.    OB History    Gravida  2   Para  1   Term  1   Preterm      AB      Living  1     SAB      TAB      Ectopic      Multiple      Live Births  1           Past Medical History:  Diagnosis Date  . Headache     Past Surgical History:  Procedure Laterality Date  . TONSILLECTOMY AND ADENOIDECTOMY      Family History  Problem Relation Age of Onset  . Hypertension Mother   . Cancer Father     Social History   Tobacco Use  . Smoking status: Never Smoker  . Smokeless tobacco: Never Used  Substance Use Topics  . Alcohol use: Not Currently    Comment: occasional  . Drug use: Never    Allergies:  Allergies  Allergen Reactions  . Prednisone Anxiety    Medications Prior to Admission  Medication Sig Dispense Refill Last Dose  . acetaminophen (TYLENOL) 500 MG tablet Take 1,000 mg by mouth every 8 (eight) hours as needed for headache.   Past Week at Unknown  time  . IRON PO Take 1 tablet by mouth daily.   08/02/2018 at Unknown time  . Prenatal MV & Min w/FA-DHA (PRENATAL ADULT GUMMY/DHA/FA PO) Take 2 each by mouth daily.   08/02/2018 at Unknown time  . fluticasone (FLONASE) 50 MCG/ACT nasal spray Place 2 sprays into both nostrils daily. (Patient not taking: Reported on 08/03/2018) 16 g 0 Not Taking at Unknown time  . predniSONE (DELTASONE) 10 MG tablet Take 6 tablets  today, on day 2 take 5 tablets, day 3 take 4 tablets, day 4 take 3 tablets, day 5 take  2 tablets and 1 tablet the last day (Patient not taking: Reported on 08/03/2018) 21 tablet 0 Not Taking at Unknown time    Review of Systems  Constitutional: Negative for chills and fever.  Eyes: Negative for visual disturbance.  Gastrointestinal: Positive for diarrhea (loose stools ), nausea and vomiting.  Genitourinary: Negative for vaginal bleeding and vaginal discharge.  Neurological: Positive for light-headedness and headaches.   Physical Exam   Blood pressure 114/74, pulse 70, temperature 98.4 F (36.9 C), temperature source Oral, resp. rate 15, height 5\' 7"  (  1.702 m), weight 98.4 kg, SpO2 99 %.  Physical Exam  Nursing note and vitals reviewed. Constitutional: She is oriented to person, place, and time. She appears well-developed and well-nourished. No distress.  HENT:  Head: Normocephalic.  Cardiovascular: Normal rate.  Respiratory: Effort normal.  GI: Soft. There is no tenderness. There is no rebound.  Neurological: She is alert and oriented to person, place, and time.  Skin: Skin is warm and dry.  Psychiatric: She has a normal mood and affect.    NST:  Baseline: 145 Variability: moderate Accels: 15x15 Decels: none Toco: none    Results for orders placed or performed during the hospital encounter of 08/03/18 (from the past 24 hour(s))  Urinalysis, Routine w reflex microscopic     Status: Abnormal   Collection Time: 08/03/18 10:26 AM  Result Value Ref Range   Color, Urine  YELLOW YELLOW   APPearance HAZY (A) CLEAR   Specific Gravity, Urine 1.013 1.005 - 1.030   pH 7.0 5.0 - 8.0   Glucose, UA NEGATIVE NEGATIVE mg/dL   Hgb urine dipstick NEGATIVE NEGATIVE   Bilirubin Urine NEGATIVE NEGATIVE   Ketones, ur NEGATIVE NEGATIVE mg/dL   Protein, ur NEGATIVE NEGATIVE mg/dL   Nitrite NEGATIVE NEGATIVE   Leukocytes, UA NEGATIVE NEGATIVE  CBC with Differential/Platelet     Status: None   Collection Time: 08/03/18 11:02 AM  Result Value Ref Range   WBC 9.2 4.0 - 10.5 K/uL   RBC 4.46 3.87 - 5.11 MIL/uL   Hemoglobin 13.3 12.0 - 15.0 g/dL   HCT 40.9 81.1 - 91.4 %   MCV 88.8 80.0 - 100.0 fL   MCH 29.8 26.0 - 34.0 pg   MCHC 33.6 30.0 - 36.0 g/dL   RDW 78.2 95.6 - 21.3 %   Platelets 254 150 - 400 K/uL   nRBC 0.0 0.0 - 0.2 %   Neutrophils Relative % 71 %   Neutro Abs 6.5 1.7 - 7.7 K/uL   Lymphocytes Relative 25 %   Lymphs Abs 2.3 0.7 - 4.0 K/uL   Monocytes Relative 3 %   Monocytes Absolute 0.3 0.1 - 1.0 K/uL   Eosinophils Relative 1 %   Eosinophils Absolute 0.1 0.0 - 0.5 K/uL   Basophils Relative 0 %   Basophils Absolute 0.0 0.0 - 0.1 K/uL  Comprehensive metabolic panel     Status: Abnormal   Collection Time: 08/03/18 11:02 AM  Result Value Ref Range   Sodium 135 135 - 145 mmol/L   Potassium 4.1 3.5 - 5.1 mmol/L   Chloride 103 98 - 111 mmol/L   CO2 23 22 - 32 mmol/L   Glucose, Bld 74 70 - 99 mg/dL   BUN <5 (L) 6 - 20 mg/dL   Creatinine, Ser 0.86 0.44 - 1.00 mg/dL   Calcium 8.9 8.9 - 57.8 mg/dL   Total Protein 6.5 6.5 - 8.1 g/dL   Albumin 3.3 (L) 3.5 - 5.0 g/dL   AST 18 15 - 41 U/L   ALT 13 0 - 44 U/L   Alkaline Phosphatase 140 (H) 38 - 126 U/L   Total Bilirubin 0.8 0.3 - 1.2 mg/dL   GFR calc non Af Amer >60 >60 mL/min   GFR calc Af Amer >60 >60 mL/min   Anion gap 9 5 - 15     MAU Course  Procedures  MDM Patient has had LR bolus. She reports feeling better. Headache has improved She has been completely normotensive here. NST reactive    Assessment and  Plan   1. Headache in pregnancy, antepartum, third trimester   2. [redacted] weeks gestation of pregnancy   3. Non-intractable vomiting with nausea, unspecified vomiting type    DC home Comfort measures reviewed  3rd Trimester precautions  labor precautions  Fetal kick counts RX: none  Return to MAU as needed FU with OB as planned  Follow-up Information    [facility], [facility]   Specialty:  Obstetrics and Gynecology Contact information: [address] [place] [address] [phone]            Thressa ShellerHeather Tavionna Grout 08/03/2018, 12:36 PM

## 2018-08-09 DIAGNOSIS — Z3483 Encounter for supervision of other normal pregnancy, third trimester: Secondary | ICD-10-CM | POA: Diagnosis not present

## 2018-08-11 ENCOUNTER — Telehealth (HOSPITAL_COMMUNITY): Payer: Self-pay | Admitting: *Deleted

## 2018-08-11 ENCOUNTER — Encounter (HOSPITAL_COMMUNITY): Payer: Self-pay | Admitting: *Deleted

## 2018-08-11 NOTE — Telephone Encounter (Signed)
Preadmission screen  

## 2018-08-12 NOTE — H&P (Signed)
HPI: 32 y/o G2P1001 @ 8367w1d estimated gestational age (as dated by LMP c/w 20 week ultrasound) presents for scheduled IOL.   no Leaking of Fluid,   no Vaginal Bleeding,   irregular Uterine Contractions,  + Fetal Movement.  Prenatal care has been provided by Dr. Charlotta Newtonzan  ROS: no HA, no epigastric pain, no visual changes.    Pregnancy uncomplicated   Prenatal Transfer Tool  Maternal Diabetes: No Genetic Screening: Normal Maternal Ultrasounds/Referrals: Normal Fetal Ultrasounds or other Referrals:  None Maternal Substance Abuse:  No Significant Maternal Medications:  None Significant Maternal Lab Results: Lab values include: Group B Strep negative   PNL:  GBS neg, Rub Immune, Hep B neg, RPR NR, HIV neg, GC/C neg, glucola:140, 3hr within normal limits Hgb: 12.2 Blood type: A positive, antibody negative  Immunizations: Declined Tdap and flu shot  OBHx: 2016- FTNSVD- female, 7#9oz uncomplicated PMHx:  none Meds:  PNV, iron  Allergy:   Allergies  Allergen Reactions  . Prednisone Anxiety   SurgHx: none SocHx:   no Tobacco, no  EtOH, no Illicit Drugs  O: There were no vitals taken for this visit.  Examination in office Gen. AAOx3, NAD CV.  RRR  No murmur.  Resp. CTAB, no wheeze or crackles. Abd. Gravid, obese, no tenderness,  no rigidity,  no guarding Extr.  no edema B/L , no calf tenderness, neg Homan's B/L FHT: 140 by doppler SVE: 1-2/25/-3, vertex on exam   Labs: see orders  A/P:  32 y.o. G2P1001 @ 667w1d EGA who presents for IOL -FWB:  Reassuring by doppler -Labor: plan for cytotec -GBS: negative -Pain management: IV or epidural upon request -CBC, T&S to be obtained  Myna HidalgoJennifer Ayrabella Labombard, DO [phone] (cell) [phone] (office)

## 2018-08-14 ENCOUNTER — Inpatient Hospital Stay (HOSPITAL_COMMUNITY): Payer: BLUE CROSS/BLUE SHIELD | Admitting: Anesthesiology

## 2018-08-14 ENCOUNTER — Other Ambulatory Visit: Payer: Self-pay

## 2018-08-14 ENCOUNTER — Inpatient Hospital Stay (HOSPITAL_COMMUNITY)
Admission: RE | Admit: 2018-08-14 | Discharge: 2018-08-15 | DRG: 807 | Disposition: A | Discharge status: Home / Self Care | Payer: BLUE CROSS/BLUE SHIELD | Attending: Obstetrics & Gynecology | Admitting: Obstetrics & Gynecology

## 2018-08-14 ENCOUNTER — Encounter (HOSPITAL_COMMUNITY): Payer: Self-pay

## 2018-08-14 DIAGNOSIS — Z3A40 40 weeks gestation of pregnancy: Secondary | ICD-10-CM | POA: Diagnosis not present

## 2018-08-14 DIAGNOSIS — Z3A41 41 weeks gestation of pregnancy: Secondary | ICD-10-CM | POA: Diagnosis not present

## 2018-08-14 DIAGNOSIS — Z3483 Encounter for supervision of other normal pregnancy, third trimester: Secondary | ICD-10-CM | POA: Diagnosis not present

## 2018-08-14 LAB — CBC
HCT: 38.7 % (ref 36.0–46.0)
HEMOGLOBIN: 13.3 g/dL (ref 12.0–15.0)
MCH: 30.3 pg (ref 26.0–34.0)
MCHC: 34.4 g/dL (ref 30.0–36.0)
MCV: 88.2 fL (ref 80.0–100.0)
Platelets: 296 10*3/uL (ref 150–400)
RBC: 4.39 MIL/uL (ref 3.87–5.11)
RDW: 13.9 % (ref 11.5–15.5)
WBC: 11.8 10*3/uL — ABNORMAL HIGH (ref 4.0–10.5)
nRBC: 0 % (ref 0.0–0.2)

## 2018-08-14 LAB — ABO/RH: ABO/RH(D): A POS

## 2018-08-14 LAB — TYPE AND SCREEN
ABO/RH(D): A POS
Antibody Screen: NEGATIVE

## 2018-08-14 LAB — SYPHILIS: RPR W/REFLEX TO RPR TITER AND TREPONEMAL ANTIBODIES, TRADITIONAL SCREENING AND DIAGNOSIS ALGORITHM: RPR Ser Ql: NONREACTIVE

## 2018-08-14 MED ORDER — SIMETHICONE 80 MG PO CHEW: 80.0000 mg | CHEWABLE_TABLET | ORAL | Status: DC | PRN

## 2018-08-14 MED ORDER — LACTATED RINGERS IV SOLN
500.0000 mL | Freq: Once | INTRAVENOUS | Status: AC
  Administered 2018-08-14: 500 mL via INTRAVENOUS

## 2018-08-14 MED ORDER — SOD CITRATE-CITRIC ACID 500-334 MG/5ML PO SOLN: 30.0000 mL | ORAL | Status: DC | PRN

## 2018-08-14 MED ORDER — ONDANSETRON HCL 4 MG/2ML IJ SOLN: 4.0000 mg | Freq: Four times a day (QID) | INTRAMUSCULAR | Status: DC | PRN

## 2018-08-14 MED ORDER — TERBUTALINE SULFATE 1 MG/ML IJ SOLN
0.2500 mg | Freq: Once | INTRAMUSCULAR | Status: DC | PRN
  Filled 2018-08-14: qty 1

## 2018-08-14 MED ORDER — OXYCODONE-ACETAMINOPHEN 5-325 MG PO TABS: 2.0000 | ORAL_TABLET | ORAL | Status: DC | PRN

## 2018-08-14 MED ORDER — ONDANSETRON HCL 4 MG/2ML IJ SOLN: 4.0000 mg | INTRAMUSCULAR | Status: DC | PRN

## 2018-08-14 MED ORDER — SENNOSIDES-DOCUSATE SODIUM 8.6-50 MG PO TABS
2.0000 | ORAL_TABLET | ORAL | Status: DC
  Administered 2018-08-14: 2 via ORAL
  Filled 2018-08-14: qty 2

## 2018-08-14 MED ORDER — IBUPROFEN 600 MG PO TABS
600.0000 mg | ORAL_TABLET | Freq: Four times a day (QID) | ORAL | Status: DC
  Administered 2018-08-14 – 2018-08-15 (×4): 600 mg via ORAL
  Filled 2018-08-14 (×4): qty 1

## 2018-08-14 MED ORDER — ACETAMINOPHEN 325 MG PO TABS: 650.0000 mg | ORAL_TABLET | ORAL | Status: DC | PRN

## 2018-08-14 MED ORDER — EPHEDRINE 5 MG/ML INJ: 10.0000 mg | INTRAVENOUS | Status: DC | PRN

## 2018-08-14 MED ORDER — PRENATAL MULTIVITAMIN CH
1.0000 | ORAL_TABLET | Freq: Every day | ORAL | Status: DC
  Administered 2018-08-15: 1 via ORAL
  Filled 2018-08-14: qty 1

## 2018-08-14 MED ORDER — LIDOCAINE HCL (PF) 1 % IJ SOLN
30.0000 mL | INTRAMUSCULAR | Status: DC | PRN
  Filled 2018-08-14: qty 30

## 2018-08-14 MED ORDER — DIPHENHYDRAMINE HCL 25 MG PO CAPS: 25.0000 mg | ORAL_CAPSULE | Freq: Four times a day (QID) | ORAL | Status: DC | PRN

## 2018-08-14 MED ORDER — COCONUT OIL OIL: 1.0000 "application " | TOPICAL_OIL | Status: DC | PRN

## 2018-08-14 MED ORDER — EPHEDRINE 5 MG/ML INJ
10.0000 mg | INTRAVENOUS | Status: DC | PRN
  Filled 2018-08-14: qty 2

## 2018-08-14 MED ORDER — OXYTOCIN 40 UNITS IN LACTATED RINGERS INFUSION - SIMPLE MED: 2.5000 [IU]/h | INTRAVENOUS | Status: DC

## 2018-08-14 MED ORDER — LACTATED RINGERS IV SOLN: 500.0000 mL | INTRAVENOUS | Status: DC | PRN

## 2018-08-14 MED ORDER — OXYTOCIN BOLUS FROM INFUSION
500.0000 mL | Freq: Once | INTRAVENOUS | Status: AC
  Administered 2018-08-14: 500 mL via INTRAVENOUS

## 2018-08-14 MED ORDER — PHENYLEPHRINE 40 MCG/ML (10ML) SYRINGE FOR IV PUSH (FOR BLOOD PRESSURE SUPPORT)
80.0000 ug | PREFILLED_SYRINGE | INTRAVENOUS | Status: DC | PRN
  Filled 2018-08-14 (×2): qty 10

## 2018-08-14 MED ORDER — BUTORPHANOL TARTRATE 1 MG/ML IJ SOLN: 1.0000 mg | INTRAMUSCULAR | Status: DC | PRN

## 2018-08-14 MED ORDER — DIPHENHYDRAMINE HCL 50 MG/ML IJ SOLN: 12.5000 mg | INTRAMUSCULAR | Status: DC | PRN

## 2018-08-14 MED ORDER — LIDOCAINE HCL (PF) 1 % IJ SOLN
INTRAMUSCULAR | Status: DC | PRN
  Administered 2018-08-14: 2 mL via EPIDURAL
  Administered 2018-08-14: 5 mL via EPIDURAL
  Administered 2018-08-14: 3 mL via EPIDURAL

## 2018-08-14 MED ORDER — DIBUCAINE 1 % RE OINT: 1.0000 "application " | TOPICAL_OINTMENT | RECTAL | Status: DC | PRN

## 2018-08-14 MED ORDER — ZOLPIDEM TARTRATE 5 MG PO TABS: 5.0000 mg | ORAL_TABLET | Freq: Every evening | ORAL | Status: DC | PRN

## 2018-08-14 MED ORDER — LACTATED RINGERS IV SOLN: 500.0000 mL | Freq: Once | INTRAVENOUS | Status: DC

## 2018-08-14 MED ORDER — PHENYLEPHRINE 40 MCG/ML (10ML) SYRINGE FOR IV PUSH (FOR BLOOD PRESSURE SUPPORT): 80.0000 ug | PREFILLED_SYRINGE | INTRAVENOUS | Status: DC | PRN

## 2018-08-14 MED ORDER — FENTANYL 2.5 MCG/ML BUPIVACAINE 1/10 % EPIDURAL INFUSION (WH - ANES)
14.0000 mL/h | INTRAMUSCULAR | Status: DC | PRN
  Administered 2018-08-14: 14 mL/h via EPIDURAL
  Filled 2018-08-14: qty 100

## 2018-08-14 MED ORDER — OXYTOCIN 40 UNITS IN LACTATED RINGERS INFUSION - SIMPLE MED
1.0000 m[IU]/min | INTRAVENOUS | Status: DC
  Administered 2018-08-14: 2 m[IU]/min via INTRAVENOUS
  Filled 2018-08-14: qty 1000

## 2018-08-14 MED ORDER — MISOPROSTOL 25 MCG QUARTER TABLET
25.0000 ug | ORAL_TABLET | ORAL | Status: DC | PRN
  Administered 2018-08-14: 25 ug via VAGINAL
  Filled 2018-08-14 (×3): qty 1

## 2018-08-14 MED ORDER — PHENYLEPHRINE 40 MCG/ML (10ML) SYRINGE FOR IV PUSH (FOR BLOOD PRESSURE SUPPORT)
80.0000 ug | PREFILLED_SYRINGE | INTRAVENOUS | Status: DC | PRN
  Filled 2018-08-14: qty 10

## 2018-08-14 MED ORDER — LACTATED RINGERS IV SOLN
INTRAVENOUS | Status: DC
  Administered 2018-08-14 (×3): via INTRAVENOUS

## 2018-08-14 MED ORDER — WITCH HAZEL-GLYCERIN EX PADS: 1.0000 "application " | MEDICATED_PAD | CUTANEOUS | Status: DC | PRN

## 2018-08-14 MED ORDER — ONDANSETRON HCL 4 MG PO TABS: 4.0000 mg | ORAL_TABLET | ORAL | Status: DC | PRN

## 2018-08-14 MED ORDER — OXYCODONE-ACETAMINOPHEN 5-325 MG PO TABS: 1.0000 | ORAL_TABLET | ORAL | Status: DC | PRN

## 2018-08-14 MED ORDER — BENZOCAINE-MENTHOL 20-0.5 % EX AERO
1.0000 "application " | INHALATION_SPRAY | CUTANEOUS | Status: DC | PRN
  Filled 2018-08-14: qty 56

## 2018-08-14 NOTE — Progress Notes (Signed)
OB PN:  S: Starting to feel more painful contractions this am and requesting epidural  O: BP 132/85   Pulse 87   Temp 98.1 F (36.7 C) (Oral)   Resp 16   Ht 5\' 7"  (1.702 m)   Wt 100.7 kg   BMI 34.79 kg/m   FHT: 140bpm, moderate variablity, + accels, no decels Toco: q1-583min SVE: deferred  A/P: 32 y.o. G2P1001 @ 2749w1d for IOL 1. FWB: Cat. I 2. Labor: continue Pit per protocol Pain: plan for epidural this am GBS: negative  Myna HidalgoJennifer Star Resler, DO [phone] (cell) [phone] (office)

## 2018-08-14 NOTE — Progress Notes (Signed)
OB PN:  S: More comfortable with epidural  O: BP 117/77   Pulse 82   Temp 98 F (36.7 C) (Oral)   Resp 18   Ht 5\' 7"  (1.702 m)   Wt 100.7 kg   BMI 34.79 kg/m   FHT: 150bpm, moderate variablity, + accels, occasional earl decels Toco: q2-733min SVE: 6/70/-1, confirmed spontaneous rupture- light mec  A/P: 32 y.o. G2P1001 @ 4935w0d for IOL 1. FWB: Cat. I 2. Labor: continue Pit per protocol Pain: continue epidural GBS: neg  Myna HidalgoJennifer Sherronda Sweigert, DO [phone] (cell) [phone] (office)

## 2018-08-14 NOTE — Anesthesia Preprocedure Evaluation (Signed)
Anesthesia Evaluation  Patient identified by MRN, date of birth, ID band Patient awake    Reviewed: Allergy & Precautions, Patient's Chart, lab work & pertinent test results  Airway Mallampati: II  TM Distance: >3 FB     Dental   Pulmonary neg pulmonary ROS,    Pulmonary exam normal        Cardiovascular negative cardio ROS Normal cardiovascular exam     Neuro/Psych negative neurological ROS     GI/Hepatic negative GI ROS, Neg liver ROS,   Endo/Other  negative endocrine ROS  Renal/GU negative Renal ROS     Musculoskeletal   Abdominal   Peds  Hematology negative hematology ROS (+)   Anesthesia Other Findings   Reproductive/Obstetrics (+) Pregnancy                             Lab Results  Component Value Date   WBC 11.8 (H) 08/14/2018   HGB 13.3 08/14/2018   HCT 38.7 08/14/2018   MCV 88.2 08/14/2018   PLT 296 08/14/2018    Anesthesia Physical Anesthesia Plan  ASA: II  Anesthesia Plan: Epidural   Post-op Pain Management:    Induction:   PONV Risk Score and Plan: Treatment may vary due to age or medical condition  Airway Management Planned: Natural Airway  Additional Equipment:   Intra-op Plan:   Post-operative Plan:   Informed Consent: I have reviewed the patients History and Physical, chart, labs and discussed the procedure including the risks, benefits and alternatives for the proposed anesthesia with the patient or authorized representative who has indicated his/her understanding and acceptance.     Plan Discussed with:   Anesthesia Plan Comments:         Anesthesia Quick Evaluation

## 2018-08-14 NOTE — Anesthesia Pain Management Evaluation Note (Signed)
  CRNA Pain Management Visit Note  Patient: Yvonne Brown, 32 y.o., female  "Hello I am a [facility] of the anesthesia [facility] at [facility]. We have an anesthesia team available at all times to provide care throughout the hospital, including epidural management and anesthesia for C-section. I don't know your plan for the delivery whether it a natural birth, water birth, IV sedation, nitrous supplementation, doula or epidural, but we want to meet your pain goals."   1.Was your pain managed to your expectations on prior hospitalizations?   Yes   2.What is your expectation for pain management during this hospitalization?     Epidural  3.How can we help you reach that goal? Support prn  Record the patient's initial score and the patient's pain goal.   Pain: 7  Pain Goal: 4 [facility] wants you to be able to say your pain was always managed very well.  [facility],Burke Terry 08/14/2018

## 2018-08-14 NOTE — Lactation Note (Signed)
This note was copied from a baby's chart. Lactation Consultation Note  Patient Name: Yvonne Henri MedalChrista Tantillo ZOXWR'UToday's Date: 08/14/2018 Reason for consult: Initial assessment;Term  8 hours old FT female who is being exclusively BF by her mother, she's a P2 and experienced BF. She was able to BF her first child for 19 months. Mom is already familiar with hand expression and colostrum was observed on both breasts [facility]. Mom has a DEBP at home.  Offered assistance with latch but mom politely declined, but once LC pointed out that baby was cueing, mom immediately put her to the breast and baby started feeding right away. Mom had baby swaddled in her blanket, advised mom to try STS on the next feeding, but dad came to mom's bed and removed the blanket as soon as LC spoke about the benefits of STS. Multiple audible swallows noted, baby still nursing when exiting the room.  Discussed cluster feeding and normal newborn behavior the first 24 hours of life. Mom and dad had a few questions, one of mom's was if she should use a Vit. "D" supplement with baby. Advised mom to try a daily Vit "D" OTC for baby, she's not sure if she'll do it at the hospital, she told LC that she was taking her pre-natal vitamins but that baby hasn't being given a Vit D supplement yet.  Feeding plan:  1. Encouraged mom to feed baby STS 8-12 times/24 hours or sooner if feeding cues are present 2. Hand expression and spoon feeding was also encouraged  BF brochure, BF resources and feeding diary were reviewed. Parents reported all questions and concerns were answered, they're both aware of LC services and will call PRN.  Maternal Data Formula Feeding for Exclusion: No Has patient been taught Hand Expression?: Yes Does the patient have breastfeeding experience prior to this delivery?: Yes  Feeding Feeding Type: Breast Fed  LATCH Score Latch: Grasps breast easily, tongue down, lips flanged, rhythmical  sucking.  Audible Swallowing: Spontaneous and intermittent  Type of Nipple: Everted at rest and after stimulation  Comfort (Breast/Nipple): Soft / non-tender  Hold (Positioning): Assistance needed to correctly position infant at breast and maintain latch.(minimal, only instructed mom to unswadled to do STS)  LATCH Score: 9  Interventions Interventions: Breast feeding basics reviewed;Assisted with latch;Skin to skin;Breast massage;Hand express;Breast compression;Adjust position;Support pillows  Lactation Tools Discussed/Used WIC Program: No   Consult Status Consult Status: Follow-up Date: 08/15/18 Follow-up type: In-patient    Tyresse Jayson Venetia ConstableS Kalle Bernath 08/14/2018, 9:20 PM

## 2018-08-14 NOTE — Anesthesia Procedure Notes (Signed)
Epidural Patient location during procedure: OB Start time: 08/14/2018 8:30 AM End time: 08/14/2018 8:36 AM  Staffing Anesthesiologist: Marcene DuosFitzgerald, Ressie Slevin, MD Performed: anesthesiologist   Preanesthetic Checklist Completed: patient identified, site marked, surgical consent, pre-op evaluation, timeout performed, IV checked, risks and benefits discussed and monitors and equipment checked  Epidural Patient position: sitting Prep: site prepped and draped and DuraPrep Patient monitoring: continuous pulse ox and blood pressure Approach: midline Location: L4-L5 Injection technique: LOR air  Needle:  Needle type: Tuohy  Needle gauge: 17 G Needle length: 9 cm and 9 Needle insertion depth: 6 cm Catheter type: closed end flexible Catheter size: 19 Gauge Catheter at skin depth: 11 cm Test dose: negative  Assessment Events: blood not aspirated, injection not painful, no injection resistance, negative IV test and no paresthesia

## 2018-08-15 ENCOUNTER — Encounter (HOSPITAL_COMMUNITY): Payer: Self-pay

## 2018-08-15 LAB — CBC
HCT: 33.8 % — ABNORMAL LOW (ref 36.0–46.0)
Hemoglobin: 11.3 g/dL — ABNORMAL LOW (ref 12.0–15.0)
MCH: 29.5 pg (ref 26.0–34.0)
MCHC: 33.4 g/dL (ref 30.0–36.0)
MCV: 88.3 fL (ref 80.0–100.0)
Platelets: 228 10*3/uL (ref 150–400)
RBC: 3.83 MIL/uL — ABNORMAL LOW (ref 3.87–5.11)
RDW: 14.1 % (ref 11.5–15.5)
WBC: 12.7 10*3/uL — ABNORMAL HIGH (ref 4.0–10.5)
nRBC: 0 % (ref 0.0–0.2)

## 2018-08-15 MED ORDER — ACETAMINOPHEN 325 MG PO TABS: 650.0000 mg | ORAL_TABLET | Freq: Four times a day (QID) | ORAL | 0 refills | Status: AC | PRN

## 2018-08-15 MED ORDER — IBUPROFEN 600 MG PO TABS: 600.0000 mg | ORAL_TABLET | Freq: Four times a day (QID) | ORAL | 0 refills | Status: DC

## 2018-08-15 NOTE — Progress Notes (Signed)
Postpartum Note Day # 1  S:  Patient resting comfortable in bed.  Pain controlled.  Tolerating general diet. No flatus, no BM.  Lochia moderate.  Ambulating without difficulty.  She denies n/v/f/c, SOB, or CP.  Pt plans on breastfeeding.  O: Temp:  [97.7 F (36.5 C)-99 F (37.2 C)] 98.2 F (36.8 C) (12/16 0458) Pulse Rate:  [67-102] 74 (12/16 0458) Resp:  [16-18] 16 (12/16 0458) BP: (91-132)/(50-93) 120/88 (12/16 0458) SpO2:  [98 %-100 %] 98 % (12/16 0458)   Gen: A&Ox3, NAD CV: RRR Resp: CTAB Abdomen: soft, NT, ND Uterus: firm, non-tender, below umbilicus Ext: No edema, no calf tenderness bilaterally Labs:  Recent Labs    08/14/18 0035 08/15/18 0526  HGB 13.3 11.3*    A/P: Pt is a 32 y.o. [id number] s/p NSVD, PPD#1  - Pain well controlled -GU: voiding freely -GI: Tolerating general diet -Activity: encouraged sitting up to chair and ambulation as tolerated -Prophylaxis: early ambulation -Labs: stable as above  Continue postpartum care as outlined above, considering early discharge home pending pediatrician  Myna HidalgoJennifer Shellia Hartl, DO [phone] (cell) [phone] (office)

## 2018-08-15 NOTE — Anesthesia Postprocedure Evaluation (Signed)
Anesthesia Post Note  Patient: Futures traderChrista Brown  Procedure(s) Performed: AN AD HOC LABOR EPIDURAL     Patient location during evaluation: Mother Baby Anesthesia Type: Epidural Level of consciousness: awake and alert and oriented Pain management: satisfactory to patient Vital Signs Assessment: post-procedure vital signs reviewed and stable Respiratory status: respiratory function stable Cardiovascular status: stable Postop Assessment: no headache, no backache, epidural receding, patient able to bend at knees, no signs of nausea or vomiting and adequate PO intake Anesthetic complications: no    Last Vitals:  Vitals:   08/14/18 2302 08/15/18 0458  BP: 91/66 120/88  Pulse: 72 74  Resp: 16 16  Temp: 36.5 C 36.8 C  SpO2: 98% 98%    Last Pain:  Vitals:   08/15/18 0720  TempSrc:   PainSc: Asleep   Pain Goal:                 [name]

## 2018-08-15 NOTE — Discharge Instructions (Signed)
Vaginal Delivery, Care After °Refer to this sheet in the next few weeks. These instructions provide you with information about caring for yourself after vaginal delivery. Your health care provider may also give you more specific instructions. Your treatment has been planned according to current medical practices, but problems sometimes occur. Call your health care provider if you have any problems or questions. °What can I expect after the procedure? °After vaginal delivery, it is common to have: °· Some bleeding from your vagina. °· Soreness in your abdomen, your vagina, and the area of skin between your vaginal opening and your anus (perineum). °· Pelvic cramps. °· Fatigue. ° °Follow these instructions at home: °Medicines °· Take over-the-counter and prescription medicines only as told by your health care provider. °· If you were prescribed an antibiotic medicine, take it as told by your health care provider. Do not stop taking the antibiotic until it is finished. °Driving ° °· Do not drive or operate heavy machinery while taking prescription pain medicine. °· Do not drive for 24 hours if you received a sedative. °Lifestyle °· Do not drink alcohol. This is especially important if you are breastfeeding or taking medicine to relieve pain. °· Do not use tobacco products, including cigarettes, chewing tobacco, or e-cigarettes. If you need help quitting, ask your health care provider. °Eating and drinking °· Drink at least 8 eight-ounce glasses of water every day unless you are told not to by your health care provider. If you choose to breastfeed your baby, you may need to drink more water than this. °· Eat high-fiber foods every day. These foods may help prevent or relieve constipation. High-fiber foods include: °? Whole grain cereals and breads. °? Brown rice. °? Beans. °? Fresh fruits and vegetables. °Activity °· Return to your normal activities as told by your health care provider. Ask your health care provider  what activities are safe for you. °· Rest as much as possible. Try to rest or take a nap when your baby is sleeping. °· Do not lift anything that is heavier than your baby or 10 lb (4.5 kg) until your health care provider says that it is safe. °· Talk with your health care provider about when you can engage in sexual activity. This may depend on your: °? Risk of infection. °? Rate of healing. °? Comfort and desire to engage in sexual activity. °Vaginal Care °· If you have an episiotomy or a vaginal tear, check the area every day for signs of infection. Check for: °? More redness, swelling, or pain. °? More fluid or blood. °? Warmth. °? Pus or a bad smell. °· Do not use tampons or douches until your health care provider says this is safe. °· Watch for any blood clots that may pass from your vagina. These may look like clumps of dark red, brown, or black discharge. °General instructions °· Keep your perineum clean and dry as told by your health care provider. °· Wear loose, comfortable clothing. °· Wipe from front to back when you use the toilet. °· Ask your health care provider if you can shower or take a bath. If you had an episiotomy or a perineal tear during labor and delivery, your health care provider may tell you not to take baths for a certain length of time. °· Wear a bra that supports your breasts and fits you well. °· If possible, have someone help you with household activities and help care for your baby for at least a few days after   you leave the hospital. °· Keep all follow-up visits for you and your baby as told by your health care provider. This is important. °Contact a health care provider if: °· You have: °? Vaginal discharge that has a bad smell. °? Difficulty urinating. °? Pain when urinating. °? A sudden increase or decrease in the frequency of your bowel movements. °? More redness, swelling, or pain around your episiotomy or vaginal tear. °? More fluid or blood coming from your episiotomy or  vaginal tear. °? Pus or a bad smell coming from your episiotomy or vaginal tear. °? A fever. °? A rash. °? Little or no interest in activities you used to enjoy. °? Questions about caring for yourself or your baby. °· Your episiotomy or vaginal tear feels warm to the touch. °· Your episiotomy or vaginal tear is separating or does not appear to be healing. °· Your breasts are painful, hard, or turn red. °· You feel unusually sad or worried. °· You feel nauseous or you vomit. °· You pass large blood clots from your vagina. If you pass a blood clot from your vagina, save it to show to your health care provider. Do not flush blood clots down the toilet without having your health care provider look at them. °· You urinate more than usual. °· You are dizzy or light-headed. °· You have not breastfed at all and you have not had a menstrual period for 12 weeks after delivery. °· You have stopped breastfeeding and you have not had a menstrual period for 12 weeks after you stopped breastfeeding. °Get help right away if: °· You have: °? Pain that does not go away or does not get better with medicine. °? Chest pain. °? Difficulty breathing. °? Blurred vision or spots in your vision. °? Thoughts about hurting yourself or your baby. °· You develop pain in your abdomen or in one of your legs. °· You develop a severe headache. °· You faint. °· You bleed from your vagina so much that you fill two sanitary pads in one hour. °This information is not intended to replace advice given to you by your health care provider. Make sure you discuss any questions you have with your health care provider. °Document Released: 08/14/2000 Document Revised: 01/29/2016 Document Reviewed: 09/01/2015 °Elsevier Interactive Patient Education © 2018 [employer] ° °

## 2018-08-15 NOTE — Plan of Care (Signed)
Patient appropriate for discharge.

## 2018-08-15 NOTE — Discharge Summary (Signed)
OB Discharge Summary     Patient Name: Yvonne MedalChrista Bussiere DOB: 05/10/1986 MRN: [id number]  Date of admission: 08/14/2018 Delivering MD: Myna HidalgoZAN, Juwann Sherk   Date of discharge: 08/15/2018  Admitting diagnosis: 40 week induction Intrauterine pregnancy: 2570w0d     Secondary diagnosis:  Active Problems:   Labor and delivery, indication for care  Additional problems: none     Discharge diagnosis: Term Pregnancy Delivered                                                                                                Post partum procedures:n/a  Augmentation: Pitocin and Cytotec  Complications: None  Hospital course:  Induction of Labor With Vaginal Delivery   32 y.o. yo G2P1001 at 7170w0d was admitted to the hospital 08/14/2018 for induction of labor.  Indication for induction: Favorable cervix at term.  Patient had an uncomplicated labor course as follows: Membrane Rupture Time/Date: 9:08 AM ,08/14/2018   Intrapartum Procedures: Episiotomy: Right Mediolateral [4]                                         Lacerations:  None [1]  Patient had delivery of a Viable infant.  Information for the patient's newborn:  Elba Barmanvans, Girl Judene [[id number]][[id number]]  Delivery Method: Vaginal, Spontaneous(Filed from Delivery Summary)   08/14/2018  Details of delivery can be found in separate delivery note.  Patient had a routine postpartum course. Patient is discharged home 08/15/18.  Physical exam  Vitals:   08/14/18 2003 08/14/18 2302 08/15/18 0458 08/15/18 1408  BP: 111/77 91/66 120/88 126/71  Pulse: 67 72 74 72  Resp: 18 16 16 17   Temp: 98.2 F (36.8 C) 97.7 F (36.5 C) 98.2 F (36.8 C) 98.1 F (36.7 C)  TempSrc: Oral Oral Oral Oral  SpO2: 98% 98% 98% 98%  Weight:      Height:       General: alert, cooperative and no distress Lochia: appropriate Uterine Fundus: firm Incision: N/A DVT Evaluation: No evidence of DVT seen on physical exam. Labs: Lab Results  Component Value Date   WBC 12.7 (H)  08/15/2018   HGB 11.3 (L) 08/15/2018   HCT 33.8 (L) 08/15/2018   MCV 88.3 08/15/2018   PLT 228 08/15/2018   CMP Latest Ref Rng & Units 08/03/2018  Glucose 70 - 99 mg/dL 74  BUN 6 - 20 mg/dL <7(W<5(L)  Creatinine 2.950.44 - 1.00 mg/dL 6.210.44  Sodium 308135 - 657145 mmol/L 135  Potassium 3.5 - 5.1 mmol/L 4.1  Chloride 98 - 111 mmol/L 103  CO2 22 - 32 mmol/L 23  Calcium 8.9 - 10.3 mg/dL 8.9  Total Protein 6.5 - 8.1 g/dL 6.5  Total Bilirubin 0.3 - 1.2 mg/dL 0.8  Alkaline Phos 38 - 126 U/L 140(H)  AST 15 - 41 U/L 18  ALT 0 - 44 U/L 13    Discharge instruction: per After Visit Summary and "Baby and Me Booklet".  After visit meds:  Allergies as of 08/15/2018  Reactions   Prednisone Anxiety      Medication List    STOP taking these medications   ferrous sulfate 325 (65 FE) MG tablet     TAKE these medications   acetaminophen 325 MG tablet Commonly known as:  TYLENOL Take 2 tablets (650 mg total) by mouth every 6 (six) hours as needed (for pain scale < 4). What changed:    medication strength  how much to take  when to take this  reasons to take this   ibuprofen 600 MG tablet Commonly known as:  ADVIL,MOTRIN Take 1 tablet (600 mg total) by mouth every 6 (six) hours.   prenatal multivitamin Tabs tablet Take 1 tablet by mouth at bedtime.       Diet: routine diet  Activity: Advance as tolerated. Pelvic rest for 6 weeks.   Outpatient follow up:6 weeks Follow up Appt:No future appointments. Follow up Visit:No follow-ups on file.  Postpartum contraception: Undecided  Newborn Data: Live born female  Birth Weight: 8 lb 3.8 oz (3737 g) APGAR: 9, 9  Newborn Delivery   Birth date/time:  08/14/2018 12:46:00 Delivery type:  Vaginal, Spontaneous     Baby Feeding: Breast Disposition:home with mother   08/15/2018 Sharon Seller, DO

## 2018-09-26 DIAGNOSIS — Z124 Encounter for screening for malignant neoplasm of cervix: Secondary | ICD-10-CM | POA: Diagnosis not present

## 2019-05-22 DIAGNOSIS — Z01419 Encounter for gynecological examination (general) (routine) without abnormal findings: Secondary | ICD-10-CM | POA: Diagnosis not present

## 2020-05-24 DIAGNOSIS — Z01419 Encounter for gynecological examination (general) (routine) without abnormal findings: Secondary | ICD-10-CM | POA: Diagnosis not present

## 2020-07-08 DIAGNOSIS — S0990XA Unspecified injury of head, initial encounter: Secondary | ICD-10-CM | POA: Diagnosis not present

## 2020-07-10 DIAGNOSIS — M9902 Segmental and somatic dysfunction of thoracic region: Secondary | ICD-10-CM | POA: Diagnosis not present

## 2020-07-10 DIAGNOSIS — M9901 Segmental and somatic dysfunction of cervical region: Secondary | ICD-10-CM | POA: Diagnosis not present

## 2020-07-10 DIAGNOSIS — M6240 Contracture of muscle, unspecified site: Secondary | ICD-10-CM | POA: Diagnosis not present

## 2020-07-12 DIAGNOSIS — M9901 Segmental and somatic dysfunction of cervical region: Secondary | ICD-10-CM | POA: Diagnosis not present

## 2020-07-12 DIAGNOSIS — M9902 Segmental and somatic dysfunction of thoracic region: Secondary | ICD-10-CM | POA: Diagnosis not present

## 2020-07-12 DIAGNOSIS — M6240 Contracture of muscle, unspecified site: Secondary | ICD-10-CM | POA: Diagnosis not present

## 2020-07-16 DIAGNOSIS — M9901 Segmental and somatic dysfunction of cervical region: Secondary | ICD-10-CM | POA: Diagnosis not present

## 2020-07-16 DIAGNOSIS — M6240 Contracture of muscle, unspecified site: Secondary | ICD-10-CM | POA: Diagnosis not present

## 2020-07-16 DIAGNOSIS — M9902 Segmental and somatic dysfunction of thoracic region: Secondary | ICD-10-CM | POA: Diagnosis not present

## 2020-07-23 DIAGNOSIS — Z20822 Contact with and (suspected) exposure to covid-19: Secondary | ICD-10-CM | POA: Diagnosis not present

## 2020-07-23 DIAGNOSIS — R52 Pain, unspecified: Secondary | ICD-10-CM | POA: Diagnosis not present

## 2020-07-23 DIAGNOSIS — R509 Fever, unspecified: Secondary | ICD-10-CM | POA: Diagnosis not present

## 2020-07-23 DIAGNOSIS — J020 Streptococcal pharyngitis: Secondary | ICD-10-CM | POA: Diagnosis not present

## 2021-01-26 DIAGNOSIS — Z03818 Encounter for observation for suspected exposure to other biological agents ruled out: Secondary | ICD-10-CM | POA: Diagnosis not present

## 2021-01-26 DIAGNOSIS — U071 COVID-19: Secondary | ICD-10-CM | POA: Diagnosis not present

## 2021-01-26 DIAGNOSIS — J0140 Acute pansinusitis, unspecified: Secondary | ICD-10-CM | POA: Diagnosis not present

## 2021-05-26 DIAGNOSIS — Z01419 Encounter for gynecological examination (general) (routine) without abnormal findings: Secondary | ICD-10-CM | POA: Diagnosis not present

## 2021-07-08 DIAGNOSIS — J069 Acute upper respiratory infection, unspecified: Secondary | ICD-10-CM | POA: Diagnosis not present

## 2021-07-08 DIAGNOSIS — M791 Myalgia, unspecified site: Secondary | ICD-10-CM | POA: Diagnosis not present

## 2021-07-08 DIAGNOSIS — Z20822 Contact with and (suspected) exposure to covid-19: Secondary | ICD-10-CM | POA: Diagnosis not present

## 2021-07-11 DIAGNOSIS — R52 Pain, unspecified: Secondary | ICD-10-CM | POA: Diagnosis not present

## 2021-07-11 DIAGNOSIS — R059 Cough, unspecified: Secondary | ICD-10-CM | POA: Diagnosis not present

## 2021-07-11 DIAGNOSIS — J3489 Other specified disorders of nose and nasal sinuses: Secondary | ICD-10-CM | POA: Diagnosis not present

## 2021-08-13 ENCOUNTER — Other Ambulatory Visit: Payer: Self-pay

## 2021-08-13 ENCOUNTER — Ambulatory Visit (HOSPITAL_COMMUNITY)
Admission: EM | Admit: 2021-08-13 | Discharge: 2021-08-13 | Disposition: A | Discharge status: Home / Self Care | Payer: BC Managed Care – PPO | Attending: Physician Assistant | Admitting: Physician Assistant

## 2021-08-13 ENCOUNTER — Encounter (HOSPITAL_COMMUNITY): Payer: Self-pay

## 2021-08-13 DIAGNOSIS — R0789 Other chest pain: Secondary | ICD-10-CM | POA: Insufficient documentation

## 2021-08-13 DIAGNOSIS — R1084 Generalized abdominal pain: Secondary | ICD-10-CM | POA: Diagnosis not present

## 2021-08-13 LAB — COMPREHENSIVE METABOLIC PANEL WITH GFR
ALT: 27 U/L (ref 0–44)
AST: 23 U/L (ref 15–41)
Albumin: 4 g/dL (ref 3.5–5.0)
Alkaline Phosphatase: 69 U/L (ref 38–126)
Anion gap: 7 (ref 5–15)
BUN: 5 mg/dL — ABNORMAL LOW (ref 6–20)
CO2: 26 mmol/L (ref 22–32)
Calcium: 9.1 mg/dL (ref 8.9–10.3)
Chloride: 104 mmol/L (ref 98–111)
Creatinine, Ser: 0.7 mg/dL (ref 0.44–1.00)
GFR, Estimated: >60 mL/min (ref >60)
Glucose, Bld: 86 mg/dL (ref 70–99)
Potassium: 4.2 mmol/L (ref 3.5–5.1)
Sodium: 137 mmol/L (ref 135–145)
Total Bilirubin: 0.8 mg/dL (ref 0.3–1.2)
Total Protein: 7.5 g/dL (ref 6.5–8.1)

## 2021-08-13 LAB — CBC WITH DIFFERENTIAL/PLATELET
Abs Immature Granulocytes: 0.06 10*3/uL (ref 0.00–0.07)
Basophils Absolute: 0.1 10*3/uL (ref 0.0–0.1)
Basophils Relative: 1 %
Eosinophils Absolute: 0.1 10*3/uL (ref 0.0–0.5)
Eosinophils Relative: 1 %
HCT: 42.4 % (ref 36.0–46.0)
Hemoglobin: 14.4 g/dL (ref 12.0–15.0)
Immature Granulocytes: 1 %
Lymphocytes Relative: 24 %
Lymphs Abs: 3.1 10*3/uL (ref 0.7–4.0)
MCH: 29.2 pg (ref 26.0–34.0)
MCHC: 34 g/dL (ref 30.0–36.0)
MCV: 86 fL (ref 80.0–100.0)
Monocytes Absolute: 0.6 10*3/uL (ref 0.1–1.0)
Monocytes Relative: 4 %
Neutro Abs: 8.8 10*3/uL — ABNORMAL HIGH (ref 1.7–7.7)
Neutrophils Relative %: 69 %
Platelets: 400 10*3/uL (ref 150–400)
RBC: 4.93 MIL/uL (ref 3.87–5.11)
RDW: 13.1 % (ref 11.5–15.5)
WBC: 12.6 10*3/uL — ABNORMAL HIGH (ref 4.0–10.5)
nRBC: 0 % (ref 0.0–0.2)

## 2021-08-13 LAB — LIPASE, BLOOD: Lipase: 38 U/L (ref 11–51)

## 2021-08-13 MED ORDER — LIDOCAINE VISCOUS HCL 2 % MT SOLN
OROMUCOSAL | Status: AC
  Filled 2021-08-13: qty 15

## 2021-08-13 MED ORDER — LANSOPRAZOLE 30 MG PO CPDR: 30.0000 mg | DELAYED_RELEASE_CAPSULE | Freq: Every day | ORAL | 1 refills | Status: DC

## 2021-08-13 MED ORDER — ALUM & MAG HYDROXIDE-SIMETH 200-200-20 MG/5ML PO SUSP
30.0000 mL | Freq: Once | ORAL | Status: AC
  Administered 2021-08-13: 15:00:00 30 mL via ORAL

## 2021-08-13 MED ORDER — LIDOCAINE VISCOUS HCL 2 % MT SOLN
15.0000 mL | Freq: Once | OROMUCOSAL | Status: AC
  Administered 2021-08-13: 15:00:00 15 mL via ORAL

## 2021-08-13 MED ORDER — ALUM & MAG HYDROXIDE-SIMETH 200-200-20 MG/5ML PO SUSP
ORAL | Status: AC
  Filled 2021-08-13: qty 30

## 2021-08-13 NOTE — ED Provider Notes (Signed)
[facility]    CSN: [id number] Arrival date & time: 08/13/21  1305      History   Chief Complaint Chief Complaint  Patient presents with   Chest Pain    HPI Yvonne Brown is a 35 y.o. female.   Pt complains of pain on the right side of her chest.  Pt reports she has had for over a week.  No relief with pepto.    The history is provided by the patient. No language interpreter was used.  Chest Pain Pain location:  L chest Pain quality: aching   Pain radiates to:  Upper back Pain severity:  Moderate Onset quality:  Gradual Timing:  Constant Chronicity:  New Relieved by:  Nothing Worsened by:  Nothing Ineffective treatments:  None tried Associated symptoms: abdominal pain    Past Medical History:  Diagnosis Date   Headache     Patient Active Problem List   Diagnosis Date Noted   Labor and delivery, indication for care 08/14/2018    Past Surgical History:  Procedure Laterality Date   TONSILLECTOMY AND ADENOIDECTOMY      OB History     Gravida  2   Para  1   Term  1   Preterm      AB      Living  1      SAB      IAB      Ectopic      Multiple      Live Births  1            Home Medications    Prior to Admission medications   Medication Sig Start Date End Date Taking? Authorizing Provider  lansoprazole (PREVACID) 30 MG capsule Take 1 capsule (30 mg total) by mouth daily at 12 noon. 08/13/21  Yes Cheron Schaumann K, PA-C  acetaminophen (TYLENOL) 325 MG tablet Take 2 tablets (650 mg total) by mouth every 6 (six) hours as needed (for pain scale < 4). 08/15/18   Myna Hidalgo, DO  ibuprofen (ADVIL,MOTRIN) 600 MG tablet Take 1 tablet (600 mg total) by mouth every 6 (six) hours. 08/15/18   Myna Hidalgo, DO    Family History Family History  Problem Relation Age of Onset   Hypertension Mother    Cancer Father     Social History Social History   Tobacco Use   Smoking status: Never   Smokeless tobacco: Never  Vaping  Use   Vaping Use: Never used  Substance Use Topics   Alcohol use: Not Currently    Comment: occasional   Drug use: Never     Allergies   Prednisone   Review of Systems Review of Systems  Cardiovascular:  Positive for chest pain.  Gastrointestinal:  Positive for abdominal pain.  All other systems reviewed and are negative.   Physical Exam Triage Vital Signs ED Triage Vitals  Enc Vitals Group     BP 08/13/21 1400 130/89     Pulse Rate 08/13/21 1400 80     Resp 08/13/21 1400 18     Temp 08/13/21 1400 98.1 F (36.7 C)     Temp Source 08/13/21 1400 Oral     SpO2 08/13/21 1400 98 %     Weight --      Height --      Head Circumference --      Peak Flow --      Pain Score 08/13/21 1401 6     Pain Loc --  Pain Edu? --      Excl. in GC? --    No data found.  Updated Vital Signs BP 130/89 (BP Location: Left Arm)    Pulse 80    Temp 98.1 F (36.7 C) (Oral)    Resp 18    LMP 07/30/2021    SpO2 98%    Breastfeeding No   Visual Acuity Right Eye Distance:   Left Eye Distance:   Bilateral Distance:    Right Eye Near:   Left Eye Near:    Bilateral Near:     Physical Exam Vitals and nursing note reviewed.  Constitutional:      Appearance: She is well-developed.  HENT:     Head: Normocephalic.  Cardiovascular:     Rate and Rhythm: Normal rate and regular rhythm.     Heart sounds: Normal heart sounds.  Pulmonary:     Effort: Pulmonary effort is normal.     Breath sounds: Normal breath sounds.  Abdominal:     General: There is no distension.     Palpations: Abdomen is soft.     Tenderness: There is abdominal tenderness.  Musculoskeletal:        General: Normal range of motion.     Cervical back: Normal range of motion.  Skin:    General: Skin is warm.  Neurological:     General: No focal deficit present.     Mental Status: She is alert and oriented to person, place, and time.     UC Treatments / Results  Labs (all labs ordered are listed, but only  abnormal results are displayed) Labs Reviewed  CBC WITH DIFFERENTIAL/PLATELET - Abnormal; Notable for the following components:      Result Value   WBC 12.6 (*)    Neutro Abs 8.8 (*)    All other components within normal limits  COMPREHENSIVE METABOLIC PANEL - Abnormal; Notable for the following components:   BUN 5 (*)    All other components within normal limits  LIPASE, BLOOD    EKG   Radiology No results found.  Procedures Procedures (including critical care time)  Medications Ordered in UC Medications  alum & mag hydroxide-simeth (MAALOX/MYLANTA) 200-200-20 MG/5ML suspension 30 mL (30 mLs Oral Given 08/13/21 1449)    And  lidocaine (XYLOCAINE) 2 % viscous mouth solution 15 mL (15 mLs Oral Given 08/13/21 1449)    Initial Impression / Assessment and Plan / UC Course  I have reviewed the triage vital signs and the nursing notes.  Pertinent labs & imaging results that were available during my care of the patient were reviewed by me and considered in my medical decision making (see chart for details).    MDM:  labs obtained,  normal lipase and lfts,  EKg normal.  Pt reports minimal relief with mylanta/  I will try pt on prevacid.  Pt advised follow up with primary  Final Clinical Impressions(s) / UC Diagnoses   Final diagnoses:  Chest wall pain  Generalized abdominal pain     Discharge Instructions      Return if any problems.    ED Prescriptions     Medication Sig Dispense Auth. Provider   lansoprazole (PREVACID) 30 MG capsule Take 1 capsule (30 mg total) by mouth daily at 12 noon. 30 capsule Elson Areas, [place]      PDMP not reviewed this encounter. An After Visit Summary was printed and given to the patient.    Elson Areas, [place] 08/13/21 1551

## 2021-08-13 NOTE — Discharge Instructions (Addendum)
Return if any problems.

## 2021-08-13 NOTE — ED Triage Notes (Signed)
Pt c/o rt sided chest pain radiating to rt shoulder x1wk. States pain starts after eating. Took pepto with no relief.

## 2021-08-14 ENCOUNTER — Encounter (HOSPITAL_COMMUNITY): Payer: Self-pay

## 2021-08-19 DIAGNOSIS — R102 Pelvic and perineal pain: Secondary | ICD-10-CM | POA: Diagnosis not present

## 2021-09-22 ENCOUNTER — Ambulatory Visit (HOSPITAL_COMMUNITY)
Admission: EM | Admit: 2021-09-22 | Discharge: 2021-09-22 | Disposition: A | Discharge status: Home / Self Care | Payer: BC Managed Care – PPO | Attending: Student | Admitting: Student

## 2021-09-22 ENCOUNTER — Encounter (HOSPITAL_COMMUNITY): Payer: Self-pay

## 2021-09-22 ENCOUNTER — Other Ambulatory Visit: Payer: Self-pay

## 2021-09-22 ENCOUNTER — Ambulatory Visit (INDEPENDENT_AMBULATORY_CARE_PROVIDER_SITE_OTHER): Payer: BC Managed Care – PPO

## 2021-09-22 DIAGNOSIS — R079 Chest pain, unspecified: Secondary | ICD-10-CM

## 2021-09-22 DIAGNOSIS — R1013 Epigastric pain: Secondary | ICD-10-CM | POA: Diagnosis not present

## 2021-09-22 MED ORDER — LANSOPRAZOLE 30 MG PO CPDR: 30.0000 mg | DELAYED_RELEASE_CAPSULE | Freq: Two times a day (BID) | ORAL | 1 refills | Status: DC

## 2021-09-22 NOTE — ED Provider Notes (Signed)
[facility]    CSN: [id number] Arrival date & time: 09/22/21  1644      History   Chief Complaint Chief Complaint  Patient presents with   Nausea   chest discomfort    HPI Yvonne Brown is a 36 y.o. female presenting with epigastric pain and right-sided chest pain for 2 months. History gastric ulcer per pt.  We last saw her for this 1 month ago, was advised to follow-up with PCP, she has an appointment with the PCP in 2 months but has not established with them yet.  In the interim, symptoms only gotten worse, right-sided chest pain is now very concerning to her.  States that she is having epigastric burning radiating up to her chest, this is worse at night when laying flat and after eating.  It is associated with nausea but rarely vomiting.  Denies radiation of the pain to her right upper or lower quadrant or back.  She does still have her appendix and gallbladder, no history of abdominal procedures.  Denies associated diarrhea.  States that sometimes she feels short of breath when laying flat at night, but never feels this way otherwise.  She has incidental right sided chest wall pain, this is very concerning to her.  Denies cough, congestion, fever/chills.  No history of pulmonary disease.  She has been taking the lansoprazole as prescribed at last visit without improvement.  Denies denies excessive alcohol, NSAIDs, spicy foods  HPI  Past Medical History:  Diagnosis Date   Headache     Patient Active Problem List   Diagnosis Date Noted   Labor and delivery, indication for care 08/14/2018    Past Surgical History:  Procedure Laterality Date   TONSILLECTOMY AND ADENOIDECTOMY      OB History     Gravida  2   Para  1   Term  1   Preterm      AB      Living  1      SAB      IAB      Ectopic      Multiple      Live Births  1            Home Medications    Prior to Admission medications   Medication Sig Start Date End Date Taking?  Authorizing Provider  acetaminophen (TYLENOL) 325 MG tablet Take 2 tablets (650 mg total) by mouth every 6 (six) hours as needed (for pain scale < 4). 08/15/18   Myna Hidalgo, DO  ibuprofen (ADVIL,MOTRIN) 600 MG tablet Take 1 tablet (600 mg total) by mouth every 6 (six) hours. 08/15/18   Myna Hidalgo, DO  lansoprazole (PREVACID) 30 MG capsule Take 1 capsule (30 mg total) by mouth 2 (two) times daily before a meal. 09/22/21   Rhys Martini, PA-C    Family History Family History  Problem Relation Age of Onset   Hypertension Mother    Cancer Father     Social History Social History   Tobacco Use   Smoking status: Never   Smokeless tobacco: Never  Vaping Use   Vaping Use: Never used  Substance Use Topics   Alcohol use: Not Currently    Comment: occasional   Drug use: Never     Allergies   Prednisone   Review of Systems Review of Systems  Gastrointestinal:  Positive for abdominal pain and nausea.  All other systems reviewed and are negative.   Physical Exam Triage Vital Signs  ED Triage Vitals  Enc Vitals Group     BP 09/22/21 1741 128/90     Pulse Rate 09/22/21 1741 84     Resp 09/22/21 1741 17     Temp 09/22/21 1741 99.1 F (37.3 C)     Temp Source 09/22/21 1741 Oral     SpO2 09/22/21 1741 98 %     Weight --      Height --      Head Circumference --      Peak Flow --      Pain Score 09/22/21 1740 5     Pain Loc --      Pain Edu? --      Excl. in GC? --    No data found.  Updated Vital Signs BP 128/90 (BP Location: Right Arm)    Pulse 84    Temp 99.1 F (37.3 C) (Oral)    Resp 17    LMP 08/22/2021 (Exact Date)    SpO2 98%   Visual Acuity Right Eye Distance:   Left Eye Distance:   Bilateral Distance:    Right Eye Near:   Left Eye Near:    Bilateral Near:     Physical Exam Vitals reviewed.  Constitutional:      General: She is not in acute distress.    Appearance: Normal appearance. She is not ill-appearing.  HENT:     Head: Normocephalic  and atraumatic.     Mouth/Throat:     Mouth: Mucous membranes are moist.     Comments: Moist mucous membranes Eyes:     Extraocular Movements: Extraocular movements intact.     Pupils: Pupils are equal, round, and reactive to light.  Cardiovascular:     Rate and Rhythm: Normal rate and regular rhythm.     Heart sounds: Normal heart sounds.  Pulmonary:     Effort: Pulmonary effort is normal.     Breath sounds: Normal breath sounds. No wheezing, rhonchi or rales.  Chest:     Chest wall: Tenderness present.     Comments: R anterior chest wall pain to palpation.  Abdominal:     General: Bowel sounds are normal. There is no distension.     Palpations: Abdomen is soft. There is no mass.     Tenderness: There is abdominal tenderness in the epigastric area. There is no right CVA tenderness, left CVA tenderness, guarding or rebound. Negative signs include Murphy's sign, Rovsing's sign and McBurney's sign.     Comments: Epigastric TTP without guarding, rebound, mass. Comfortable throughout exam.   Skin:    General: Skin is warm.     Capillary Refill: Capillary refill takes less than 2 seconds.     Comments: Good skin turgor  Neurological:     General: No focal deficit present.     Mental Status: She is alert and oriented to person, place, and time.  Psychiatric:        Mood and Affect: Mood normal.        Behavior: Behavior normal.     UC Treatments / Results  Labs (all labs ordered are listed, but only abnormal results are displayed) Labs Reviewed - No data to display  EKG   Radiology DG Chest 2 View  Result Date: 09/22/2021 CLINICAL DATA:  Right-sided chest pain over the last 2 months. EXAM: CHEST - 2 VIEW COMPARISON:  None. FINDINGS: Heart size is normal. Mediastinal shadows are normal. The lungs are clear. No bronchial thickening. No infiltrate, mass, effusion or  collapse. Pulmonary vascularity is normal. No bony abnormality. IMPRESSION: Normal chest Electronically Signed   By:  Paulina Fusi M.D.   On: 09/22/2021 18:13    Procedures Procedures (including critical care time)  Medications Ordered in UC Medications - No data to display  Initial Impression / Assessment and Plan / UC Course  I have reviewed the triage vital signs and the nursing notes.  Pertinent labs & imaging results that were available during my care of the patient were reviewed by me and considered in my medical decision making (see chart for details).     This patient is a very pleasant 36 y.o. year old female presenting with R chest wall pain and suspected GERD/gastric ulcer.  She has a self-reported history of gastric ulcer.  We last saw her for this 1 month ago.  She has been taking the lansoprazole as directed for the last month without improvement. GI cocktail was administered last visit, she declines this today.  We also checked lab work including lipase at last visit, did not check this again today.  There is some epigastric pain on exam, but no right upper or lower quadrant pain to palpation.  She has incidental right anterior chest wall pain to palpation, reassurance provided but she is adamant that she requires a chest x-ray to rule out pulmonary disease.  After much discussion, we proceeded with a chest x-ray against medical advice.  Will increase the lansoprazole to bid today. As she does not establish care with a PCP for another 2 months, recommended she call GI this week to establish care with them herself.  If symptoms get worse in the meantime, head to the emergency department.  Last menstrual period 08/22/2021, states she took a negative pregnancy test 1 day ago.  Final Clinical Impressions(s) / UC Diagnoses   Final diagnoses:  Abdominal pain, epigastric     Discharge Instructions      -Your xray is normal.  -Increase the lansoprazole to twice daily with breakfast and dinner.  -Avoid acidic foods, spicy foods, ibuprofen, coffee, etc. -I suspect your symptoms are related to  gastritis/ GERD. I can't rule out an acute issue like gallbladder disease at urgent care. If you develop worsening abdominal pain, head to the ED.  -Call a gastroenterologist to schedule a follow-up. Information below.  -Establish care with PCP as scheduled on 10/2021.   ED Prescriptions     Medication Sig Dispense Auth. Provider   lansoprazole (PREVACID) 30 MG capsule Take 1 capsule (30 mg total) by mouth 2 (two) times daily before a meal. 30 capsule Rhys Martini, PA-C      PDMP not reviewed this encounter.   Rhys Martini, PA-C 09/22/21 1902

## 2021-09-22 NOTE — Discharge Instructions (Addendum)
-  Your xray is normal.  -Increase the lansoprazole to twice daily with breakfast and dinner.  -Avoid acidic foods, spicy foods, ibuprofen, coffee, etc. -I suspect your symptoms are related to gastritis/ GERD. I can't rule out an acute issue like gallbladder disease at urgent care. If you develop worsening abdominal pain, head to the ED.  -Call a gastroenterologist to schedule a follow-up. Information below.  -Establish care with PCP as scheduled on 10/2021.

## 2021-09-22 NOTE — ED Triage Notes (Signed)
Pt presents with c/o R side chest pain x 2 months.   States it has gotten worse and states the pain keep her up at night and has extreme nausea.

## 2021-09-23 ENCOUNTER — Encounter: Payer: Self-pay | Admitting: Physician Assistant

## 2021-09-29 DIAGNOSIS — Z1152 Encounter for screening for COVID-19: Secondary | ICD-10-CM | POA: Diagnosis not present

## 2021-09-29 DIAGNOSIS — Z20822 Contact with and (suspected) exposure to covid-19: Secondary | ICD-10-CM | POA: Diagnosis not present

## 2021-09-29 DIAGNOSIS — J3489 Other specified disorders of nose and nasal sinuses: Secondary | ICD-10-CM | POA: Diagnosis not present

## 2021-09-29 DIAGNOSIS — J020 Streptococcal pharyngitis: Secondary | ICD-10-CM | POA: Diagnosis not present

## 2021-10-08 ENCOUNTER — Ambulatory Visit (INDEPENDENT_AMBULATORY_CARE_PROVIDER_SITE_OTHER): Payer: BC Managed Care – PPO | Admitting: Physician Assistant

## 2021-10-08 ENCOUNTER — Encounter: Payer: Self-pay | Admitting: Physician Assistant

## 2021-10-08 VITALS — BP 118/68 | HR 72 | Ht 67.0 in | Wt 216.0 lb

## 2021-10-08 DIAGNOSIS — K219 Gastro-esophageal reflux disease without esophagitis: Secondary | ICD-10-CM | POA: Diagnosis not present

## 2021-10-08 DIAGNOSIS — R0789 Other chest pain: Secondary | ICD-10-CM

## 2021-10-08 MED ORDER — PANTOPRAZOLE SODIUM 40 MG PO TBEC: 40.0000 mg | DELAYED_RELEASE_TABLET | Freq: Two times a day (BID) | ORAL | 3 refills | Status: DC

## 2021-10-08 NOTE — Progress Notes (Signed)
Chief Complaint: GERD and atypical chest pain  HPI:    Yvonne Brown is a 36 year old female with a past medical history as listed below, who was referred to me by Yvonne Hidalgo, DO for a complaint of GERD and abdominal pain.    09/22/2021 patient seen in the urgent care for nausea and chest discomfort.  At that time described epigastric pain and right-sided chest pain for 2 months.  Discussed a history of gastric ulcer.  Apparently she was seen a month ago for the same and was advised to follow with PCP.  Symptoms had worsened.  Chest x-ray is normal.  Labs were checked at the previous visit including lipase and CMP which were normal.  Interestingly CBC did show an elevated white count at 12.6.  At that time her Lansoprazole was increased to twice daily.    Today, the patient presents to clinic and tells me that she has had trouble with reflux and what she thought was an ulcer in the past and has been on various antacids here and there but nothing ever seems to help.  Over the past 3 months she has been experiencing reflux and heartburn as well as a right-sided chest pain which radiates through to her back which was concerning to her.  Tells me this pain is worse when she would eat and it made it hard for her to sleep.  She was started on Lansoprazole 30 mg daily about a month and a half ago and this got slightly better but continued and was seen again at the urgent care and this was increased and it is slightly better, but she continues with problems.  Does discuss a history of headaches and using Excedrin a lot in the past, she tries to avoid this now and uses Tylenol instead but even "a lot of Tylenol" can bother her stomach.  Also describes taking a drink of alcohol a week ago and it caused an intense burning and she vomited.  Feels like something is going on in her stomach.    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    Denies fever, chills, weight loss, blood in her stool or change in  bowel habits.     Past Medical History:  Diagnosis Date   Headache     Past Surgical History:  Procedure Laterality Date   TONSILLECTOMY AND ADENOIDECTOMY      Current Outpatient Medications  Medication Sig Dispense Refill   acetaminophen (TYLENOL) 325 MG tablet Take 2 tablets (650 mg total) by mouth every 6 (six) hours as needed (for pain scale < 4). 30 tablet 0   ibuprofen (ADVIL,MOTRIN) 600 MG tablet Take 1 tablet (600 mg total) by mouth every 6 (six) hours. 30 tablet 0   lansoprazole (PREVACID) 30 MG capsule Take 1 capsule (30 mg total) by mouth 2 (two) times daily before a meal. 30 capsule 1   No current facility-administered medications for this visit.    Allergies as of 10/08/2021 - Review Complete 09/22/2021  Allergen Reaction Noted   Prednisone Anxiety 08/03/2018    Family History  Problem Relation Age of Onset   Hypertension Mother    Cancer Father     Social History   Socioeconomic History   Marital status: Married    Spouse name: Not on file   Number of children: 1   Years of education: Not on file   Highest education level: Not on file  Occupational History   Not on file  Tobacco Use   Smoking status: Never   Smokeless tobacco: Never  Vaping Use   Vaping Use: Never used  Substance and Sexual Activity   Alcohol use: Not Currently    Comment: occasional   Drug use: Never   Sexual activity: Yes  Other Topics Concern   Not on file  Social History Narrative   Not on file   Social Determinants of Health   Financial Resource Strain: Not on file  Food Insecurity: Not on file  Transportation Needs: Not on file  Physical Activity: Not on file  Stress: Not on file  Social Connections: Not on file  Intimate Partner Violence: Not on file    Review of Systems:    Constitutional: No weight loss, fever or chills Skin: No rash or itching Cardiovascular: No chest pain Respiratory: No SOB  Gastrointestinal: See HPI and otherwise  negative Genitourinary: No dysuria  Neurological: No headache, dizziness or syncope Musculoskeletal: No new muscle or joint pain Hematologic: No bleeding  Psychiatric: No history of depression or anxiety   Physical Exam:  Vital signs: BP 118/68    Pulse 72    Ht 5\' 7"  (1.702 m)    Wt 216 lb (98 kg)    BMI 33.83 kg/m    Constitutional:   Pleasant overweight Caucasian female appears to be in NAD, Well developed, Well nourished, alert and cooperative Head:  Normocephalic and atraumatic. Eyes:   PEERL, EOMI. No icterus. Conjunctiva pink. Ears:  Normal auditory acuity. Neck:  Supple Throat: Oral cavity and pharynx without inflammation, swelling or lesion.  Respiratory: Respirations even and unlabored. Lungs clear to auscultation bilaterally.   No wheezes, crackles, or rhonchi.  Cardiovascular: Normal S1, S2. No MRG. Regular rate and rhythm. No peripheral edema, cyanosis or pallor.  Gastrointestinal:  Soft, nondistended, mild epigastric ttp. No rebound or guarding. Normal bowel sounds. No appreciable masses or hepatomegaly. Rectal:  Not performed.  Msk:  Symmetrical without gross deformities. Without edema, no deformity or joint abnormality.  Neurologic:  Alert and  oriented x4;  grossly normal neurologically.  Skin:   Dry and intact without significant lesions or rashes. Psychiatric: Demonstrates good judgement and reason without abnormal affect or behaviors.  RELEVANT LABS AND IMAGING: CBC    Component Value Date/Time   WBC 12.6 (H) 08/13/2021 1417   RBC 4.93 08/13/2021 1417   HGB 14.4 08/13/2021 1417   HCT 42.4 08/13/2021 1417   PLT 400 08/13/2021 1417   MCV 86.0 08/13/2021 1417   MCH 29.2 08/13/2021 1417   MCHC 34.0 08/13/2021 1417   RDW 13.1 08/13/2021 1417   LYMPHSABS 3.1 08/13/2021 1417   MONOABS 0.6 08/13/2021 1417   EOSABS 0.1 08/13/2021 1417   BASOSABS 0.1 08/13/2021 1417    CMP     Component Value Date/Time   NA 137 08/13/2021 1417   K 4.2 08/13/2021 1417   CL  104 08/13/2021 1417   CO2 26 08/13/2021 1417   GLUCOSE 86 08/13/2021 1417   BUN 5 (L) 08/13/2021 1417   CREATININE 0.70 08/13/2021 1417   CREATININE 0.73 01/08/2014 1102   CALCIUM 9.1 08/13/2021 1417   PROT 7.5 08/13/2021 1417   ALBUMIN 4.0 08/13/2021 1417   AST 23 08/13/2021 1417   ALT 27 08/13/2021 1417   ALKPHOS 69 08/13/2021 1417   BILITOT 0.8 08/13/2021 1417   GFRNONAA >60 08/13/2021 1417   GFRAA >60 08/03/2018 1102    Assessment: 1.  GERD: For her whole life off-and-on, worse over the past 3  months with some atypical chest pain and nausea; consider PUD +/- H. pylori +/- gastritis 2.  Atypical chest pain: Work-up in the urgent care and normal, likely related to above  Plan: 1.  Scheduled patient for diagnostic EGD in the [facility] with Dr. Orvan Falconer.  Did provide the patient a detailed list of risks for the procedure and she agrees to proceed. 2.  Stop Lansoprazole.  Prescribed Pantoprazole 40 mg twice daily, 30-60 minutes before breakfast and dinner.  #60 with 3 refills. 3.  Reviewed antireflux diet and lifestyle modifications. 4.  Patient to follow in clinic per recommendations Dr. Orvan Falconer after time of procedure.  Hyacinth Meeker, PA-C [facility] 10/08/2021, 10:10 AM  Cc: Yvonne Hidalgo, DO

## 2021-10-08 NOTE — Patient Instructions (Signed)
You have been scheduled for an endoscopy. Please follow written instructions given to you at your visit today. If you use inhalers (even only as needed), please bring them with you on the day of your procedure.   Change Pantoprazole to 40 mg twice a day 30-60 minutes before breakfast   STOP Lansoprazole  Follow up per recommendations after Endoscopy  Due to recent changes in healthcare laws, you may see the results of your imaging and laboratory studies on MyChart before your provider has had a chance to review them.  We understand that in some cases there may be results that are confusing or concerning to you. Not all laboratory results come back in the same time frame and the provider may be waiting for multiple results in order to interpret others.  Please give [place] 48 hours in order for your provider to thoroughly review all the results before contacting the office for clarification of your results.    If you are age 76 or older, your body mass index should be between 23-30. Your Body mass index is 33.83 kg/m. If this is out of the aforementioned range listed, please consider follow up with your Primary Care Provider.  If you are age 64 or younger, your body mass index should be between 19-25. Your Body mass index is 33.83 kg/m. If this is out of the aformentioned range listed, please consider follow up with your Primary Care Provider.   ________________________________________________________  The [facility] providers would like to encourage you to use [facility] to communicate with providers for non-urgent requests or questions.  Due to long hold times on the telephone, sending your provider a message by [facility] may be a faster and more efficient way to get a response.  Please allow 48 business hours for a response.  Please remember that this is for non-urgent requests.  _______________________________________________________   Thank you for choosing [facility]  Lovett Calender

## 2021-10-09 NOTE — Progress Notes (Signed)
Reviewed and agree with management plans. ? ?Shirle Provencal L. Trayvond Viets, MD, MPH  ?

## 2021-10-14 DIAGNOSIS — N83201 Unspecified ovarian cyst, right side: Secondary | ICD-10-CM | POA: Diagnosis not present

## 2021-10-28 ENCOUNTER — Telehealth: Payer: Self-pay

## 2021-10-28 ENCOUNTER — Other Ambulatory Visit: Payer: Self-pay

## 2021-10-28 ENCOUNTER — Encounter: Payer: Self-pay | Admitting: Gastroenterology

## 2021-10-28 ENCOUNTER — Ambulatory Visit (AMBULATORY_SURGERY_CENTER): Payer: BC Managed Care – PPO | Admitting: Gastroenterology

## 2021-10-28 VITALS — BP 96/72 | HR 66 | Temp 97.7°F | Resp 12 | Ht 67.0 in | Wt 216.0 lb

## 2021-10-28 DIAGNOSIS — K2950 Unspecified chronic gastritis without bleeding: Secondary | ICD-10-CM | POA: Diagnosis not present

## 2021-10-28 DIAGNOSIS — K229 Disease of esophagus, unspecified: Secondary | ICD-10-CM | POA: Diagnosis not present

## 2021-10-28 DIAGNOSIS — K31A0 Gastric intestinal metaplasia, unspecified: Secondary | ICD-10-CM | POA: Diagnosis not present

## 2021-10-28 DIAGNOSIS — K219 Gastro-esophageal reflux disease without esophagitis: Secondary | ICD-10-CM

## 2021-10-28 DIAGNOSIS — K2970 Gastritis, unspecified, without bleeding: Secondary | ICD-10-CM

## 2021-10-28 MED ORDER — SODIUM CHLORIDE 0.9 % IV SOLN: 500.0000 mL | Freq: Once | INTRAVENOUS | Status: DC

## 2021-10-28 NOTE — Progress Notes (Signed)
Indications for procedure: GERD x years that has been worse of the last 3 months, nausea, epigastric and right-sided atypical chest pain  See the 10/08/21 office note from the consultation with Hyacinth Meeker. There has been no change in history of physical exam. The patient remains an appropriate candidate for monitored anesthesia care in the [facility].

## 2021-10-28 NOTE — Telephone Encounter (Signed)
-----   Message from Tressia Danas, MD sent at 10/28/2021 10:05 AM EST ----- Regarding: Office follow-up Please schedule follow-up with Yvonne Brown in 4-6 weeks. Thanks.  KLB

## 2021-10-28 NOTE — Op Note (Signed)
[facility] Patient Name: Yvonne Brown Procedure Date: 10/28/2021 9:56 AM MRN: [id number] Endoscopist: Tressia Danas MD, MD Age: 36 Referring MD:  Date of Birth: October 15, 1985 Gender: Female Account #: [id number] Procedure:                Upper GI endoscopy Indications:              Esophageal reflux symptoms that persist despite                            appropriate therapy, Unexplained chest pain, Nausea Medicines:                Monitored Anesthesia Care Procedure:                Pre-Anesthesia Assessment:                           - Prior to the procedure, a History and Physical                            was performed, and patient medications and                            allergies were reviewed. The patient's tolerance of                            previous anesthesia was also reviewed. The risks                            and benefits of the procedure and the sedation                            options and risks were discussed with the patient.                            All questions were answered, and informed consent                            was obtained. Prior Anticoagulants: The patient has                            taken no previous anticoagulant or antiplatelet                            agents. ASA Grade Assessment: II - A patient with                            mild systemic disease. After reviewing the risks                            and benefits, the patient was deemed in                            satisfactory condition to undergo the procedure.  After obtaining informed consent, the endoscope was                            passed under direct vision. Throughout the                            procedure, the patient's blood pressure, pulse, and                            oxygen saturations were monitored continuously. The                            GIF [id number] #[id number] was introduced through the                            mouth,  and advanced to the third part of duodenum.                            The upper GI endoscopy was accomplished without                            difficulty. The patient tolerated the procedure                            well. Scope In: Scope Out: Findings:                 LA Grade A (one or more mucosal breaks less than 5                            mm, not extending between tops of 2 mucosal folds)                            esophagitis with no bleeding was found 36 cm from                            the incisors. Biopsies were taken from the                            mid/proximal and distal esophagus with a cold                            forceps for histology. Estimated blood loss was                            minimal.                           The entire examined stomach was normal except for a                            prominent fold in the antrum. Biopsies were taken  from the antrum, body, and fundus with a cold                            forceps for histology. Estimated blood loss was                            minimal.                           The examined duodenum was normal. Biopsies were                            taken with a cold forceps for histology. Estimated                            blood loss was minimal.                           Hill II hiatal hernia. The cardia and gastric                            fundus were normal on retroflexion.                           The exam was otherwise without abnormality. Complications:            No immediate complications. Estimated blood loss:                            Minimal. Estimated Blood Loss:     Estimated blood loss was minimal. Impression:               - LA Grade A reflux esophagitis with no bleeding.                            Biopsied.                           - Normal stomach. Biopsied.                           - Hiatal hernia.                           - Normal examined duodenum.  Biopsied.                           - The examination was otherwise normal. Recommendation:           - Patient has a contact number available for                            emergencies. The signs and symptoms of potential                            delayed complications were discussed with the  patient. Return to normal activities tomorrow.                            Written discharge instructions were provided to the                            patient.                           - Resume previous diet.                           - Continue present medications including                            pantoprazole 40 mg twice daily.                           - Await pathology results. Tressia Danas MD, MD 10/28/2021 10:20:15 AM This report has been signed electronically.

## 2021-10-28 NOTE — Progress Notes (Signed)
Pt in recovery with monitors in place, VSS. Report given to receiving RN. Bite guard was placed with pt awake to ensure comfort. No dental or soft tissue damage noted. 

## 2021-10-28 NOTE — Patient Instructions (Signed)
Await pathology results from the biopsies taken today.  Resume previous diet.  Continue present medications including pantoprazole 40 mg twice a day.   YOU HAD AN ENDOSCOPIC PROCEDURE TODAY AT THE [facility]:   Refer to the procedure report that was given to you for any specific questions about what was found during the examination.  If the procedure report does not answer your questions, please call your gastroenterologist to clarify.  If you requested that your care partner not be given the details of your procedure findings, then the procedure report has been included in a sealed envelope for you to review at your convenience later.  YOU SHOULD EXPECT: Some feelings of bloating in the abdomen. Passage of more gas than usual.  Walking can help get rid of the air that was put into your GI tract during the procedure and reduce the bloating. If you had a lower endoscopy (such as a colonoscopy or flexible sigmoidoscopy) you may notice spotting of blood in your stool or on the toilet paper. If you underwent a bowel prep for your procedure, you may not have a normal bowel movement for a few days.  Please Note:  You might notice some irritation and congestion in your nose or some drainage.  This is from the oxygen used during your procedure.  There is no need for concern and it should clear up in a day or so.  SYMPTOMS TO REPORT IMMEDIATELY:   Following upper endoscopy (EGD)  Vomiting of blood or coffee ground material  New chest pain or pain under the shoulder blades  Painful or persistently difficult swallowing  New shortness of breath  Fever of 100F or higher  Black, tarry-looking stools  For urgent or emergent issues, a gastroenterologist can be reached at any hour by calling [phone]. Do not use MyChart messaging for urgent concerns.    DIET:  We do recommend a small meal at first, but then you may proceed to your regular diet.  Drink plenty of fluids but you should  avoid alcoholic beverages for 24 hours.  ACTIVITY:  You should plan to take it easy for the rest of today and you should NOT DRIVE or use heavy machinery until tomorrow (because of the sedation medicines used during the test).    FOLLOW UP: Our staff will call the number listed on your records 48-72 hours following your procedure to check on you and address any questions or concerns that you may have regarding the information given to you following your procedure. If we do not reach you, we will leave a message.  We will attempt to reach you two times.  During this call, we will ask if you have developed any symptoms of COVID 19. If you develop any symptoms (ie: fever, flu-like symptoms, shortness of breath, cough etc.) before then, please call [phone].  If you test positive for Covid 19 in the 2 weeks post procedure, please call and report this information to [place].    If any biopsies were taken you will be contacted by phone or by letter within the next 1-3 weeks.  Please call us at [phone] if you have not heard about the biopsies in 3 weeks.    SIGNATURES/CONFIDENTIALITY: You and/or your care partner have signed paperwork which will be entered into your electronic medical record.  These signatures attest to the fact that that the information above on your After Visit Summary has been reviewed and is understood.  Full responsibility of the  confidentiality of this discharge information lies with you and/or your care-partner.

## 2021-10-28 NOTE — Progress Notes (Signed)
Called to room to assist during endoscopic procedure.  Patient ID and intended procedure confirmed with present staff. Received instructions for my participation in the procedure from the performing physician.  

## 2021-10-28 NOTE — Progress Notes (Signed)
VS  DT ? ?Pt's states no medical or surgical changes since previsit or office visit. ? ?

## 2021-10-28 NOTE — Telephone Encounter (Signed)
Per Dr. Tarri Glenn request, pt has been scheduled for 4 week f/u on 11/25/21 @ 10am. Appt reminder has been mailed. Appt will reflect on AVS upon hosp discharge for pt future reference.

## 2021-10-30 ENCOUNTER — Telehealth: Payer: Self-pay | Admitting: Gastroenterology

## 2021-10-30 ENCOUNTER — Telehealth: Payer: Self-pay

## 2021-10-30 NOTE — Telephone Encounter (Signed)
Inbound call from patient requesting a call back to go over results from procedure done on 2/28. Please advise.  ?

## 2021-10-30 NOTE — Telephone Encounter (Signed)
Results not yet received nor reviewed. Called pt to inform that her provider has not yet been reviewed your results. Once her provider has reviewed her results, she will receive a call or a My Chart message about her results and along with any recommendations. LVM requesting returned call. ? ?

## 2021-10-30 NOTE — Telephone Encounter (Signed)
?  Follow up Call- ? ?Call back number 10/28/2021  ?Post procedure Call Back phone  # [phone]  ?Permission to leave phone message Yes  ?Some recent data might be hidden  ?  ? ?Patient questions: ? ?Do you have a fever, pain , or abdominal swelling? No. ?Pain Score  0 * ? ?Have you tolerated food without any problems? Yes.   ? ?Have you been able to return to your normal activities? Yes.   ? ?Do you have any questions about your discharge instructions: ?Diet   No. ?Medications  No. ?Follow up visit  No. ? ?Do you have questions or concerns about your Care? No. ? ?Actions: ?* If pain score is 4 or above: ?No action needed, pain <4. ? ?Have you developed a fever since your procedure? no ? ?2.   Have you had an respiratory symptoms (SOB or cough) since your procedure? no ? ?3.   Have you tested positive for COVID 19 since your procedure no ? ?4.   Have you had any family members/close contacts diagnosed with the COVID 19 since your procedure?  no ? ? ?If yes to any of these questions please route to Laverna Peace, RN and Karlton Lemon, RN  ? ? ?

## 2021-10-31 NOTE — Telephone Encounter (Signed)
Left a voicemail asking for a return call regarding her results. Wants to be called at [phone]. ?

## 2021-10-31 NOTE — Telephone Encounter (Signed)
Called pt and advised results have not been received nor reviewed. Reassured she will receive communication regarding her results and any recommendations once the provider has addressed the results. Verbalized acceptance and understanding. ?

## 2021-11-25 ENCOUNTER — Ambulatory Visit (INDEPENDENT_AMBULATORY_CARE_PROVIDER_SITE_OTHER): Payer: BC Managed Care – PPO | Admitting: Physician Assistant

## 2021-11-25 ENCOUNTER — Encounter: Payer: Self-pay | Admitting: Physician Assistant

## 2021-11-25 VITALS — BP 112/70 | HR 75 | Ht 67.0 in | Wt 212.0 lb

## 2021-11-25 DIAGNOSIS — R0789 Other chest pain: Secondary | ICD-10-CM | POA: Diagnosis not present

## 2021-11-25 DIAGNOSIS — K219 Gastro-esophageal reflux disease without esophagitis: Secondary | ICD-10-CM | POA: Diagnosis not present

## 2021-11-25 DIAGNOSIS — K319 Disease of stomach and duodenum, unspecified: Secondary | ICD-10-CM | POA: Diagnosis not present

## 2021-11-25 DIAGNOSIS — Z91018 Allergy to other foods: Secondary | ICD-10-CM | POA: Diagnosis not present

## 2021-11-25 MED ORDER — FAMOTIDINE 20 MG PO TABS: 20.0000 mg | ORAL_TABLET | Freq: Two times a day (BID) | ORAL | 5 refills | Status: AC

## 2021-11-25 NOTE — Progress Notes (Signed)
Reviewed and agree with management plans. ? ?Dionel Archey L. Odaliz Mcqueary, MD, MPH  ?

## 2021-11-25 NOTE — Progress Notes (Signed)
? ?Chief Complaint: Follow-up GERD ? ?HPI: ?   Yvonne Brown is a 36 year old female, known to Dr. Orvan Falconer, who presents clinic today for follow-up of her reflux.   ?   10/08/2021 patient seen in clinic by me for reflux and abdominal pain.  Discussed reflux symptoms for her whole life off-and-on worse over the past 3 months with some atypical chest pain and nausea.  She was scheduled for diagnostic EGD in the [facility] and changed from Lansoprazole to Pantoprazole 40 twice daily. ?   10/28/2021 EGD with LA grade a reflux esophagitis, normal stomach, hiatal hernia and normal duodenum.  Patient was continued on Pantoprazole 40 twice daily.  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  She was told to continue her PPI twice daily. ?   Today, the patient tells me that she feels like she is still having symptoms though her Pantoprazole 40 twice a day and Pepcid nightly help she feels like she gets some chest pain right before dinnertime and before her second dose. ?   Does describe family history of esophageal cancer in her father in his late 73s.  She does discuss he was a heavy smoker. ?   Denies fever, chills, weight loss or blood in her stool. ? ?Past Medical History:  ?Diagnosis Date  ? Allergy   ? Anxiety   ? GERD (gastroesophageal reflux disease)   ? Headache   ? ? ?Past Surgical History:  ?Procedure Laterality Date  ? TONSILLECTOMY AND ADENOIDECTOMY    ? ? ?Current Outpatient Medications  ?Medication Sig Dispense Refill  ? acetaminophen (TYLENOL) 325 MG tablet Take 2 tablets (650 mg total) by mouth every 6 (six) hours as needed (for pain scale < 4). 30 tablet 0  ? famotidine (PEPCID) 20 MG tablet Take 20 mg by mouth at bedtime.    ? OVER THE COUNTER MEDICATION [place] capsules: 2 capsules prn pain    ? pantoprazole (PROTONIX) 40 MG tablet Take 1 tablet (40 mg total) by mouth 2 (two) times daily before a meal. 60 tablet 3  ? ?No current facility-administered medications for  this visit.  ? ? ?Allergies as of 11/25/2021 - Review Complete 11/25/2021  ?Allergen Reaction Noted  ? Prednisone Anxiety 08/03/2018  ? ? ?Family History  ?Problem Relation Age of Onset  ? Hypertension Mother   ? Barrett's esophagus Mother   ? Esophageal cancer Father   ?     smoker  ? Breast cancer Maternal Grandmother   ? Colon cancer Maternal Grandfather   ? Heart disease Paternal Grandfather   ? ? ?Social History  ? ?Socioeconomic History  ? Marital status: Married  ?  Spouse name: Not on file  ? Number of children: 2  ? Years of education: Not on file  ? Highest education level: Not on file  ?Occupational History  ? Occupation: stay at home mom  ?Tobacco Use  ? Smoking status: Never  ? Smokeless tobacco: Never  ?Vaping Use  ? Vaping Use: Never used  ?Substance and Sexual Activity  ? Alcohol use: Not Currently  ? Drug use: Never  ? Sexual activity: Yes  ?Other Topics Concern  ? Not on file  ?Social History Narrative  ? Not on file  ? ?Social Determinants of Health  ? ?Financial Resource Strain: Not on file  ?Food Insecurity: Not on file  ?Transportation Needs: Not on file  ?Physical Activity: Not on file  ?Stress: Not on file  ?Social Connections: Not  on file  ?Intimate Partner Violence: Not on file  ? ? ?Review of Systems:    ?Constitutional: No weight loss, fever or chills ?Cardiovascular: No chest pain  ?Respiratory: No SOB  ?Gastrointestinal: See HPI and otherwise negative ? ? Physical Exam:  ?Vital signs: ?BP 112/70   Pulse 75   Ht 5\' 7"  (1.702 m)   Wt 212 lb (96.2 kg)   BMI 33.20 kg/m?   ? ?Constitutional:   Pleasant overweight Caucasian female appears to be in NAD, Well developed, Well nourished, alert and cooperative ?Respiratory: Respirations even and unlabored. Lungs clear to auscultation bilaterally.   No wheezes, crackles, or rhonchi.  ?Cardiovascular: Normal S1, S2. No MRG. Regular rate and rhythm. No peripheral edema, cyanosis or pallor.  ?Gastrointestinal:  Soft, nondistended, nontender. No  rebound or guarding. Normal bowel sounds. No appreciable masses or hepatomegaly. ?Rectal:  Not performed.  ?Psychiatric: Demonstrates good judgement and reason without abnormal affect or behaviors. ? ?RELEVANT LABS AND IMAGING: ?CBC ?   ?Component Value Date/Time  ? WBC 12.6 (H) 08/13/2021 1417  ? RBC 4.93 08/13/2021 1417  ? HGB 14.4 08/13/2021 1417  ? HCT 42.4 08/13/2021 1417  ? PLT 400 08/13/2021 1417  ? MCV 86.0 08/13/2021 1417  ? MCH 29.2 08/13/2021 1417  ? MCHC 34.0 08/13/2021 1417  ? RDW 13.1 08/13/2021 1417  ? LYMPHSABS 3.1 08/13/2021 1417  ? MONOABS 0.6 08/13/2021 1417  ? EOSABS 0.1 08/13/2021 1417  ? BASOSABS 0.1 08/13/2021 1417  ? ? ?CMP  ?   ?Component Value Date/Time  ? NA 137 08/13/2021 1417  ? K 4.2 08/13/2021 1417  ? CL 104 08/13/2021 1417  ? CO2 26 08/13/2021 1417  ? GLUCOSE 86 08/13/2021 1417  ? BUN 5 (L) 08/13/2021 1417  ? CREATININE 0.70 08/13/2021 1417  ? CREATININE 0.73 01/08/2014 1102  ? CALCIUM 9.1 08/13/2021 1417  ? PROT 7.5 08/13/2021 1417  ? ALBUMIN 4.0 08/13/2021 1417  ? AST 23 08/13/2021 1417  ? ALT 27 08/13/2021 1417  ? ALKPHOS 69 08/13/2021 1417  ? BILITOT 0.8 08/13/2021 1417  ? GFRNONAA >60 08/13/2021 1417  ? GFRAA >60 08/03/2018 1102  ? ? ?Assessment: ?1.  GERD: Recent EGD with LA grade a esophagitis and biopsies with intestinal metaplasia, continued symptoms on Pantoprazole 40 twice daily and Pepcid 20 mg nightly ? ?Plan: ?1.  Increase Pepcid to 20 mg every morning and nightly.  Prescribed #60 with 5 refills. ?2.  Continue Pantoprazole 40 twice daily. ?3.  Discussed biopsy results with the patient including intestinal metaplasia.  Answered her questions.  Would recommend a repeat EGD in 2 years to ensure that this looks better. ?4.  Patient to follow in clinic with 14/11/2017 as needed in the interim.  She will call and let us know how she is doing in the next couple of weeks after addition of extra Pepcid.  If still not doing well could consider Dexilant 60 mg every morning.  Discussed that  this medication is time-released and may help her. ? ?[place], PA-C ?[facility] ?11/25/2021, 10:07 AM ? ?Cc: 11/27/2021, DO  ?

## 2021-11-25 NOTE — Patient Instructions (Signed)
If you are age 34 or older, your body mass index should be between 23-30. Your Body mass index is 33.2 kg/m?Marland Kitchen If this is out of the aforementioned range listed, please consider follow up with your Primary Care Provider. ? ?If you are age 5 or younger, your body mass index should be between 19-25. Your Body mass index is 33.2 kg/m?Marland Kitchen If this is out of the aformentioned range listed, please consider follow up with your Primary Care Provider.  ? ?________________________________________________________ ? ?The [facility] providers would like to encourage you to use [facility] to communicate with providers for non-urgent requests or questions.  Due to long hold times on the telephone, sending your provider a message by [facility] may be a faster and more efficient way to get a response.  Please allow 48 business hours for a response.  Please remember that this is for non-urgent requests.  ?_______________________________________________________ ? ?Increase your pepcid to twice a day. ? ?It was a pleasure to see you today! ? ?Thank you for trusting me with your gastrointestinal care!   ? ? ?

## 2022-01-09 DIAGNOSIS — J029 Acute pharyngitis, unspecified: Secondary | ICD-10-CM | POA: Diagnosis not present

## 2022-01-09 DIAGNOSIS — Z20822 Contact with and (suspected) exposure to covid-19: Secondary | ICD-10-CM | POA: Diagnosis not present

## 2022-01-09 DIAGNOSIS — B349 Viral infection, unspecified: Secondary | ICD-10-CM | POA: Diagnosis not present

## 2022-02-05 ENCOUNTER — Other Ambulatory Visit: Payer: Self-pay | Admitting: *Deleted

## 2022-02-05 MED ORDER — PANTOPRAZOLE SODIUM 40 MG PO TBEC: 40.0000 mg | DELAYED_RELEASE_TABLET | Freq: Two times a day (BID) | ORAL | 3 refills | Status: DC

## 2022-05-11 DIAGNOSIS — R109 Unspecified abdominal pain: Secondary | ICD-10-CM | POA: Diagnosis not present

## 2022-05-12 DIAGNOSIS — R197 Diarrhea, unspecified: Secondary | ICD-10-CM | POA: Diagnosis not present

## 2022-05-22 ENCOUNTER — Other Ambulatory Visit: Payer: Self-pay | Admitting: Gastroenterology

## 2022-05-22 MED ORDER — PANTOPRAZOLE SODIUM 40 MG PO TBEC: 40.0000 mg | DELAYED_RELEASE_TABLET | Freq: Two times a day (BID) | ORAL | 5 refills | Status: AC

## 2022-05-27 DIAGNOSIS — Z01419 Encounter for gynecological examination (general) (routine) without abnormal findings: Secondary | ICD-10-CM | POA: Diagnosis not present

## 2022-05-29 DIAGNOSIS — K319 Disease of stomach and duodenum, unspecified: Secondary | ICD-10-CM | POA: Diagnosis not present

## 2022-06-05 DIAGNOSIS — K319 Disease of stomach and duodenum, unspecified: Secondary | ICD-10-CM | POA: Diagnosis not present

## 2022-06-05 DIAGNOSIS — Z1331 Encounter for screening for depression: Secondary | ICD-10-CM | POA: Diagnosis not present

## 2022-06-05 DIAGNOSIS — Z1339 Encounter for screening examination for other mental health and behavioral disorders: Secondary | ICD-10-CM | POA: Diagnosis not present

## 2022-06-05 DIAGNOSIS — Z0000 Encounter for general adult medical examination without abnormal findings: Secondary | ICD-10-CM | POA: Diagnosis not present

## 2022-06-05 DIAGNOSIS — L659 Nonscarring hair loss, unspecified: Secondary | ICD-10-CM | POA: Diagnosis not present

## 2022-08-27 DIAGNOSIS — J029 Acute pharyngitis, unspecified: Secondary | ICD-10-CM | POA: Diagnosis not present

## 2022-08-27 DIAGNOSIS — J0140 Acute pansinusitis, unspecified: Secondary | ICD-10-CM | POA: Diagnosis not present

## 2022-08-27 DIAGNOSIS — K219 Gastro-esophageal reflux disease without esophagitis: Secondary | ICD-10-CM | POA: Diagnosis not present

## 2022-08-27 DIAGNOSIS — R051 Acute cough: Secondary | ICD-10-CM | POA: Diagnosis not present

## 2023-05-31 ENCOUNTER — Other Ambulatory Visit (HOSPITAL_COMMUNITY)
Admission: RE | Admit: 2023-05-31 | Discharge: 2023-05-31 | Disposition: A | Discharge status: Home / Self Care | Payer: BC Managed Care – PPO | Source: Ambulatory Visit | Attending: Nurse Practitioner | Admitting: Nurse Practitioner

## 2023-05-31 ENCOUNTER — Other Ambulatory Visit: Payer: Self-pay | Admitting: Nurse Practitioner

## 2023-05-31 DIAGNOSIS — Z124 Encounter for screening for malignant neoplasm of cervix: Secondary | ICD-10-CM | POA: Diagnosis not present

## 2023-05-31 DIAGNOSIS — Z01419 Encounter for gynecological examination (general) (routine) without abnormal findings: Secondary | ICD-10-CM | POA: Diagnosis not present

## 2023-05-31 DIAGNOSIS — Z8742 Personal history of other diseases of the female genital tract: Secondary | ICD-10-CM | POA: Diagnosis not present

## 2023-05-31 DIAGNOSIS — R102 Pelvic and perineal pain: Secondary | ICD-10-CM | POA: Diagnosis not present

## 2023-06-03 LAB — CYTOLOGY - PAP
Comment: NEGATIVE
Diagnosis: NEGATIVE
High risk HPV: NEGATIVE

## 2023-06-04 DIAGNOSIS — Z1389 Encounter for screening for other disorder: Secondary | ICD-10-CM | POA: Diagnosis not present

## 2023-06-17 DIAGNOSIS — Z0000 Encounter for general adult medical examination without abnormal findings: Secondary | ICD-10-CM | POA: Diagnosis not present

## 2023-06-17 DIAGNOSIS — Z1331 Encounter for screening for depression: Secondary | ICD-10-CM | POA: Diagnosis not present

## 2023-06-17 DIAGNOSIS — E669 Obesity, unspecified: Secondary | ICD-10-CM | POA: Diagnosis not present

## 2023-06-17 DIAGNOSIS — K219 Gastro-esophageal reflux disease without esophagitis: Secondary | ICD-10-CM | POA: Diagnosis not present

## 2023-06-17 DIAGNOSIS — Z1339 Encounter for screening examination for other mental health and behavioral disorders: Secondary | ICD-10-CM | POA: Diagnosis not present
